# Patient Record
Sex: Male | Born: 1955
Health system: Southern US, Community
[De-identification: ages and names within clinical notes are randomized; demographics above are authoritative.]

## PROBLEM LIST (undated history)

## (undated) DIAGNOSIS — E785 Hyperlipidemia, unspecified: Secondary | ICD-10-CM

## (undated) DIAGNOSIS — I10 Essential (primary) hypertension: Secondary | ICD-10-CM

## (undated) HISTORY — PX: HERNIA REPAIR: SHX51

---

## 2000-06-21 ENCOUNTER — Ambulatory Visit (HOSPITAL_COMMUNITY): Admission: RE | Admit: 2000-06-21 | Discharge: 2000-06-21 | Payer: Self-pay | Admitting: Gastroenterology

## 2000-08-11 ENCOUNTER — Ambulatory Visit (HOSPITAL_COMMUNITY): Admission: RE | Admit: 2000-08-11 | Discharge: 2000-08-11 | Payer: Self-pay | Admitting: Gastroenterology

## 2004-04-08 ENCOUNTER — Ambulatory Visit (HOSPITAL_COMMUNITY): Admission: RE | Admit: 2004-04-08 | Discharge: 2004-04-08 | Payer: Self-pay | Admitting: General Surgery

## 2016-01-13 DIAGNOSIS — E782 Mixed hyperlipidemia: Secondary | ICD-10-CM | POA: Diagnosis not present

## 2016-01-13 DIAGNOSIS — K219 Gastro-esophageal reflux disease without esophagitis: Secondary | ICD-10-CM | POA: Diagnosis not present

## 2016-01-13 DIAGNOSIS — I10 Essential (primary) hypertension: Secondary | ICD-10-CM | POA: Diagnosis not present

## 2016-01-13 DIAGNOSIS — R7301 Impaired fasting glucose: Secondary | ICD-10-CM | POA: Diagnosis not present

## 2016-01-25 DIAGNOSIS — J018 Other acute sinusitis: Secondary | ICD-10-CM | POA: Diagnosis not present

## 2016-03-08 DIAGNOSIS — N400 Enlarged prostate without lower urinary tract symptoms: Secondary | ICD-10-CM | POA: Diagnosis not present

## 2016-03-08 DIAGNOSIS — N200 Calculus of kidney: Secondary | ICD-10-CM | POA: Diagnosis not present

## 2016-03-08 DIAGNOSIS — N3020 Other chronic cystitis without hematuria: Secondary | ICD-10-CM | POA: Diagnosis not present

## 2016-03-08 DIAGNOSIS — M545 Low back pain: Secondary | ICD-10-CM | POA: Diagnosis not present

## 2016-03-08 DIAGNOSIS — N401 Enlarged prostate with lower urinary tract symptoms: Secondary | ICD-10-CM | POA: Diagnosis not present

## 2016-07-21 DIAGNOSIS — K635 Polyp of colon: Secondary | ICD-10-CM | POA: Diagnosis not present

## 2016-07-21 DIAGNOSIS — D120 Benign neoplasm of cecum: Secondary | ICD-10-CM | POA: Diagnosis not present

## 2016-07-21 DIAGNOSIS — K648 Other hemorrhoids: Secondary | ICD-10-CM | POA: Diagnosis not present

## 2016-07-21 DIAGNOSIS — Z1211 Encounter for screening for malignant neoplasm of colon: Secondary | ICD-10-CM | POA: Diagnosis not present

## 2016-07-21 DIAGNOSIS — Z86010 Personal history of colonic polyps: Secondary | ICD-10-CM | POA: Diagnosis not present

## 2016-07-21 DIAGNOSIS — D125 Benign neoplasm of sigmoid colon: Secondary | ICD-10-CM | POA: Diagnosis not present

## 2016-07-21 DIAGNOSIS — K5730 Diverticulosis of large intestine without perforation or abscess without bleeding: Secondary | ICD-10-CM | POA: Diagnosis not present

## 2016-09-21 DIAGNOSIS — K648 Other hemorrhoids: Secondary | ICD-10-CM | POA: Diagnosis not present

## 2016-10-05 DIAGNOSIS — K648 Other hemorrhoids: Secondary | ICD-10-CM | POA: Diagnosis not present

## 2016-10-26 DIAGNOSIS — K219 Gastro-esophageal reflux disease without esophagitis: Secondary | ICD-10-CM | POA: Insufficient documentation

## 2016-10-26 DIAGNOSIS — K648 Other hemorrhoids: Secondary | ICD-10-CM | POA: Diagnosis not present

## 2016-10-26 DIAGNOSIS — Z86010 Personal history of colonic polyps: Secondary | ICD-10-CM | POA: Insufficient documentation

## 2016-11-02 DIAGNOSIS — K219 Gastro-esophageal reflux disease without esophagitis: Secondary | ICD-10-CM | POA: Diagnosis not present

## 2016-11-02 DIAGNOSIS — E782 Mixed hyperlipidemia: Secondary | ICD-10-CM | POA: Diagnosis not present

## 2016-11-02 DIAGNOSIS — I10 Essential (primary) hypertension: Secondary | ICD-10-CM | POA: Diagnosis not present

## 2016-11-02 DIAGNOSIS — R7301 Impaired fasting glucose: Secondary | ICD-10-CM | POA: Diagnosis not present

## 2016-11-03 DIAGNOSIS — R7301 Impaired fasting glucose: Secondary | ICD-10-CM | POA: Diagnosis not present

## 2016-11-03 DIAGNOSIS — I10 Essential (primary) hypertension: Secondary | ICD-10-CM | POA: Diagnosis not present

## 2016-11-03 DIAGNOSIS — E782 Mixed hyperlipidemia: Secondary | ICD-10-CM | POA: Diagnosis not present

## 2017-02-09 DIAGNOSIS — K219 Gastro-esophageal reflux disease without esophagitis: Secondary | ICD-10-CM | POA: Diagnosis not present

## 2017-02-09 DIAGNOSIS — R7301 Impaired fasting glucose: Secondary | ICD-10-CM | POA: Diagnosis not present

## 2017-02-09 DIAGNOSIS — I10 Essential (primary) hypertension: Secondary | ICD-10-CM | POA: Diagnosis not present

## 2017-02-09 DIAGNOSIS — E782 Mixed hyperlipidemia: Secondary | ICD-10-CM | POA: Diagnosis not present

## 2017-05-17 DIAGNOSIS — Z23 Encounter for immunization: Secondary | ICD-10-CM | POA: Diagnosis not present

## 2017-05-17 DIAGNOSIS — Z6832 Body mass index (BMI) 32.0-32.9, adult: Secondary | ICD-10-CM | POA: Diagnosis not present

## 2017-05-17 DIAGNOSIS — F17200 Nicotine dependence, unspecified, uncomplicated: Secondary | ICD-10-CM | POA: Diagnosis not present

## 2017-05-17 DIAGNOSIS — R413 Other amnesia: Secondary | ICD-10-CM | POA: Diagnosis not present

## 2017-05-17 DIAGNOSIS — Z0001 Encounter for general adult medical examination with abnormal findings: Secondary | ICD-10-CM | POA: Diagnosis not present

## 2017-05-17 DIAGNOSIS — E782 Mixed hyperlipidemia: Secondary | ICD-10-CM | POA: Diagnosis not present

## 2017-05-17 DIAGNOSIS — R7301 Impaired fasting glucose: Secondary | ICD-10-CM | POA: Diagnosis not present

## 2017-08-25 DIAGNOSIS — R7301 Impaired fasting glucose: Secondary | ICD-10-CM | POA: Diagnosis not present

## 2017-08-25 DIAGNOSIS — I10 Essential (primary) hypertension: Secondary | ICD-10-CM | POA: Diagnosis not present

## 2017-08-25 DIAGNOSIS — E782 Mixed hyperlipidemia: Secondary | ICD-10-CM | POA: Diagnosis not present

## 2017-08-25 DIAGNOSIS — K219 Gastro-esophageal reflux disease without esophagitis: Secondary | ICD-10-CM | POA: Diagnosis not present

## 2017-08-30 DIAGNOSIS — H903 Sensorineural hearing loss, bilateral: Secondary | ICD-10-CM | POA: Insufficient documentation

## 2017-08-30 DIAGNOSIS — H938X2 Other specified disorders of left ear: Secondary | ICD-10-CM | POA: Diagnosis not present

## 2017-08-30 DIAGNOSIS — H9312 Tinnitus, left ear: Secondary | ICD-10-CM | POA: Insufficient documentation

## 2017-09-12 ENCOUNTER — Ambulatory Visit: Payer: Self-pay | Admitting: Surgery

## 2017-09-12 DIAGNOSIS — L723 Sebaceous cyst: Secondary | ICD-10-CM | POA: Diagnosis not present

## 2017-09-12 NOTE — H&P (Signed)
Steve Orozco Documented: 09/12/2017 3:38 PM Location: [facility] Patient #: [id number] DOB: 05/26/1956 Married / Language: English / Race: White Male  History of Present Illness (Renessa Wellnitz A. Fredricka Bonineonnor MD; 09/12/2017 3:58 PM) Patient words: This is a very nice 62 year old man with multiple sebaceous cysts. They've been present for many years. Periodically his wife will drain them, but he's never required any procedures or medications to treatment. They don't cause him any pain in particular that when a drain there very malodorous and he would like to have them excised. Hhe has 3 on his back, one in the posterior scalp above the hairline, and one above the right ear. He also complains of several small cysts in the crease just posterior to his earlobe.  The patient is a 62 year old male.   Past Surgical History (Tanisha A. Manson PasseyBrown, RMA; 09/12/2017 3:38 PM) Colon Polyp Removal - Colonoscopy Hemorrhoidectomy Oral Surgery Tonsillectomy Vasectomy  Diagnostic Studies History (Tanisha A. Manson PasseyBrown, RMA; 09/12/2017 3:38 PM) Colonoscopy within last year  Allergies (Tanisha A. Manson PasseyBrown, RMA; 09/12/2017 3:40 PM) No Known Drug Allergies [09/12/2017]: Allergies Reconciled  Medication History (Tanisha A. Manson PasseyBrown, RMA; 09/12/2017 3:41 PM) Lisinopril (10MG  Tablet, Oral) Active. Aciphex (20MG  Tablet DR, Oral) Active. Crestor (20MG  Tablet, Oral) Active. Medications Reconciled  Social History (Tanisha A. Manson PasseyBrown, RMA; 09/12/2017 3:38 PM) Alcohol use Occasional alcohol use. Caffeine use Carbonated beverages, Coffee, Tea. No drug use Tobacco use Current every day smoker.  Family History (Tanisha A. Manson PasseyBrown, RMA; 09/12/2017 3:38 PM) Hypertension Father. Prostate Cancer Father.  Other Problems (Tanisha A. Manson PasseyBrown, RMA; 09/12/2017 3:38 PM) Gastroesophageal Reflux Disease Hepatitis Hypercholesterolemia Kidney Stone     Review of Systems (Tanisha A. Brown RMA; 09/12/2017 3:39  PM) General Not Present- Appetite Loss, Chills, Fatigue, Fever, Night Sweats, Weight Gain and Weight Loss. Skin Not Present- Change in Wart/Mole, Dryness, Hives, Jaundice, New Lesions, Non-Healing Wounds, Rash and Ulcer. HEENT Present- Hearing Loss. Not Present- Earache, Hoarseness, Nose Bleed, Oral Ulcers, Ringing in the Ears, Seasonal Allergies, Sinus Pain, Sore Throat, Visual Disturbances, Wears glasses/contact lenses and Yellow Eyes. Respiratory Not Present- Bloody sputum, Chronic Cough, Difficulty Breathing, Snoring and Wheezing. Breast Not Present- Breast Mass, Breast Pain, Nipple Discharge and Skin Changes. Cardiovascular Not Present- Chest Pain, Difficulty Breathing Lying Down, Leg Cramps, Palpitations, Rapid Heart Rate, Shortness of Breath and Swelling of Extremities. Gastrointestinal Not Present- Abdominal Pain, Bloating, Bloody Stool, Change in Bowel Habits, Chronic diarrhea, Constipation, Difficulty Swallowing, Excessive gas, Gets full quickly at meals, Hemorrhoids, Indigestion, Nausea, Rectal Pain and Vomiting. Male Genitourinary Not Present- Blood in Urine, Change in Urinary Stream, Frequency, Impotence, Nocturia, Painful Urination, Urgency and Urine Leakage. Musculoskeletal Not Present- Back Pain, Joint Pain, Joint Stiffness, Muscle Pain, Muscle Weakness and Swelling of Extremities. Neurological Not Present- Decreased Memory, Fainting, Headaches, Numbness, Seizures, Tingling, Tremor, Trouble walking and Weakness. Psychiatric Not Present- Anxiety, Bipolar, Change in Sleep Pattern, Depression, Fearful and Frequent crying. Endocrine Not Present- Cold Intolerance, Excessive Hunger, Hair Changes, Heat Intolerance, Hot flashes and New Diabetes. Hematology Not Present- Blood Thinners, Easy Bruising, Excessive bleeding, Gland problems, HIV and Persistent Infections.  Vitals (Tanisha A. Brown RMA; 09/12/2017 3:40 PM) 09/12/2017 3:39 PM Weight: 229.4 lb Height: 70in Body Surface Area: 2.21  m Body Mass Index: 32.92 kg/m  Temp.: 98.3F  Pulse: 82 (Regular)  BP: 128/76 (Sitting, Left Arm, Standard)      Physical Exam (Ajmal Kathan A. Fredricka Bonineonnor MD; 09/12/2017 4:00 PM)  General Note: alert and well appearing  Integumentary Note: warm and dry.  Behind each ear in the crease behind the earlobe, there are multiple very small papules. There is a subcentimeter cyst just superior posterior to the right ear. No active drainage. Similarly there is a subcentimeter cyst in the posterior scalp just above the hairline. There are 3 cysts along the upper and mid back the largest of which is about 3 cm in diameter  Head and Neck Note: no mass or thyromegaly  Eye Note: No scleral icterus. Extra ocular motions intact.  ENMT Note: Moist mucous membranes, dentition intact Hearing aids in place  Chest and Lung Exam Note: Unlabored respirations, clear bilaterally  Cardiovascular Note: Regular rate and rhythm, no pedal edema  Abdomen Note: Soft, nontender nondistended. No mass or organomegaly.  Neurologic Note: Grossly intact, normal gait  Neuropsychiatric Note: Normal mood and affect. Appropriate insight.  Musculoskeletal Note: Strength symmetrical throughout, no deformity    Assessment & Plan (Terea Neubauer A. Fredricka Bonine MD; 09/12/2017 4:01 PM)  SEBACEOUS CYST (L72.3) Story: Multiple sebaceous cysts, desires excision. Discussed procedure including risks of bleeding, infection, pain, scarring, wound problems. I recommended not intervening on the small papules behind his ears but we can remove the cyst above his right ear, and the posterior scalp and in the mid back. He expressed understanding and desires to proceed.

## 2017-11-24 DIAGNOSIS — R7301 Impaired fasting glucose: Secondary | ICD-10-CM | POA: Diagnosis not present

## 2017-11-24 DIAGNOSIS — I10 Essential (primary) hypertension: Secondary | ICD-10-CM | POA: Diagnosis not present

## 2017-11-24 DIAGNOSIS — E782 Mixed hyperlipidemia: Secondary | ICD-10-CM | POA: Diagnosis not present

## 2017-11-24 DIAGNOSIS — K219 Gastro-esophageal reflux disease without esophagitis: Secondary | ICD-10-CM | POA: Diagnosis not present

## 2017-11-24 DIAGNOSIS — Z125 Encounter for screening for malignant neoplasm of prostate: Secondary | ICD-10-CM | POA: Diagnosis not present

## 2018-02-20 DIAGNOSIS — N3020 Other chronic cystitis without hematuria: Secondary | ICD-10-CM | POA: Diagnosis not present

## 2018-02-20 DIAGNOSIS — N401 Enlarged prostate with lower urinary tract symptoms: Secondary | ICD-10-CM | POA: Diagnosis not present

## 2018-10-02 DIAGNOSIS — L0211 Cutaneous abscess of neck: Secondary | ICD-10-CM | POA: Diagnosis not present

## 2018-10-11 DIAGNOSIS — L820 Inflamed seborrheic keratosis: Secondary | ICD-10-CM | POA: Diagnosis not present

## 2018-10-11 DIAGNOSIS — L720 Epidermal cyst: Secondary | ICD-10-CM | POA: Diagnosis not present

## 2018-10-11 DIAGNOSIS — L918 Other hypertrophic disorders of the skin: Secondary | ICD-10-CM | POA: Diagnosis not present

## 2018-10-11 DIAGNOSIS — L7211 Pilar cyst: Secondary | ICD-10-CM | POA: Diagnosis not present

## 2018-10-11 DIAGNOSIS — L821 Other seborrheic keratosis: Secondary | ICD-10-CM | POA: Diagnosis not present

## 2018-10-15 DIAGNOSIS — L720 Epidermal cyst: Secondary | ICD-10-CM | POA: Diagnosis not present

## 2018-10-15 DIAGNOSIS — L0211 Cutaneous abscess of neck: Secondary | ICD-10-CM | POA: Diagnosis not present

## 2018-11-15 DIAGNOSIS — L720 Epidermal cyst: Secondary | ICD-10-CM | POA: Diagnosis not present

## 2018-12-10 DIAGNOSIS — K219 Gastro-esophageal reflux disease without esophagitis: Secondary | ICD-10-CM | POA: Diagnosis not present

## 2018-12-10 DIAGNOSIS — I10 Essential (primary) hypertension: Secondary | ICD-10-CM | POA: Diagnosis not present

## 2018-12-10 DIAGNOSIS — R7301 Impaired fasting glucose: Secondary | ICD-10-CM | POA: Diagnosis not present

## 2018-12-10 DIAGNOSIS — E782 Mixed hyperlipidemia: Secondary | ICD-10-CM | POA: Diagnosis not present

## 2018-12-11 DIAGNOSIS — Z125 Encounter for screening for malignant neoplasm of prostate: Secondary | ICD-10-CM | POA: Diagnosis not present

## 2018-12-11 DIAGNOSIS — I10 Essential (primary) hypertension: Secondary | ICD-10-CM | POA: Diagnosis not present

## 2018-12-11 DIAGNOSIS — R7301 Impaired fasting glucose: Secondary | ICD-10-CM | POA: Diagnosis not present

## 2018-12-11 DIAGNOSIS — E782 Mixed hyperlipidemia: Secondary | ICD-10-CM | POA: Diagnosis not present

## 2019-01-21 ENCOUNTER — Inpatient Hospital Stay (HOSPITAL_COMMUNITY)
Admission: EM | Admit: 2019-01-21 | Discharge: 2019-01-23 | DRG: 247 | Disposition: A | Discharge status: Home / Self Care | Payer: BC Managed Care – PPO | Source: Ambulatory Visit | Attending: Family Medicine | Admitting: Family Medicine

## 2019-01-21 ENCOUNTER — Emergency Department (HOSPITAL_COMMUNITY): Payer: BC Managed Care – PPO

## 2019-01-21 ENCOUNTER — Other Ambulatory Visit: Payer: Self-pay

## 2019-01-21 ENCOUNTER — Encounter (HOSPITAL_COMMUNITY): Payer: Self-pay | Admitting: *Deleted

## 2019-01-21 DIAGNOSIS — Z955 Presence of coronary angioplasty implant and graft: Secondary | ICD-10-CM

## 2019-01-21 DIAGNOSIS — E785 Hyperlipidemia, unspecified: Secondary | ICD-10-CM | POA: Diagnosis present

## 2019-01-21 DIAGNOSIS — I25110 Atherosclerotic heart disease of native coronary artery with unstable angina pectoris: Secondary | ICD-10-CM | POA: Diagnosis not present

## 2019-01-21 DIAGNOSIS — K219 Gastro-esophageal reflux disease without esophagitis: Secondary | ICD-10-CM | POA: Diagnosis present

## 2019-01-21 DIAGNOSIS — Z6832 Body mass index (BMI) 32.0-32.9, adult: Secondary | ICD-10-CM | POA: Diagnosis not present

## 2019-01-21 DIAGNOSIS — F17200 Nicotine dependence, unspecified, uncomplicated: Secondary | ICD-10-CM | POA: Diagnosis not present

## 2019-01-21 DIAGNOSIS — F17210 Nicotine dependence, cigarettes, uncomplicated: Secondary | ICD-10-CM | POA: Diagnosis not present

## 2019-01-21 DIAGNOSIS — I10 Essential (primary) hypertension: Secondary | ICD-10-CM | POA: Diagnosis not present

## 2019-01-21 DIAGNOSIS — R072 Precordial pain: Secondary | ICD-10-CM | POA: Diagnosis not present

## 2019-01-21 DIAGNOSIS — Z79899 Other long term (current) drug therapy: Secondary | ICD-10-CM | POA: Diagnosis not present

## 2019-01-21 DIAGNOSIS — Z20828 Contact with and (suspected) exposure to other viral communicable diseases: Secondary | ICD-10-CM | POA: Diagnosis present

## 2019-01-21 DIAGNOSIS — I200 Unstable angina: Secondary | ICD-10-CM | POA: Diagnosis not present

## 2019-01-21 DIAGNOSIS — Z8249 Family history of ischemic heart disease and other diseases of the circulatory system: Secondary | ICD-10-CM | POA: Diagnosis not present

## 2019-01-21 DIAGNOSIS — R079 Chest pain, unspecified: Secondary | ICD-10-CM | POA: Diagnosis not present

## 2019-01-21 DIAGNOSIS — R0902 Hypoxemia: Secondary | ICD-10-CM | POA: Diagnosis not present

## 2019-01-21 HISTORY — DX: Essential (primary) hypertension: I10

## 2019-01-21 HISTORY — DX: Hyperlipidemia, unspecified: E78.5

## 2019-01-21 LAB — CBC WITH DIFFERENTIAL/PLATELET
Abs Immature Granulocytes: 0.04 10*3/uL (ref 0.00–0.07)
Basophils Absolute: 0.1 10*3/uL (ref 0.0–0.1)
Basophils Relative: 1 %
Eosinophils Absolute: 0.3 10*3/uL (ref 0.0–0.5)
Eosinophils Relative: 4 %
HCT: 47.9 % (ref 39.0–52.0)
Hemoglobin: 15.3 g/dL (ref 13.0–17.0)
Immature Granulocytes: 0 %
Lymphocytes Relative: 37 %
Lymphs Abs: 3.4 10*3/uL (ref 0.7–4.0)
MCH: 26.9 pg (ref 26.0–34.0)
MCHC: 31.9 g/dL (ref 30.0–36.0)
MCV: 84.3 fL (ref 80.0–100.0)
Monocytes Absolute: 0.6 10*3/uL (ref 0.1–1.0)
Monocytes Relative: 6 %
Neutro Abs: 4.9 10*3/uL (ref 1.7–7.7)
Neutrophils Relative %: 52 %
Platelets: 166 10*3/uL (ref 150–400)
RBC: 5.68 MIL/uL (ref 4.22–5.81)
RDW: 13.1 % (ref 11.5–15.5)
WBC: 9.2 10*3/uL (ref 4.0–10.5)
nRBC: 0 % (ref 0.0–0.2)

## 2019-01-21 LAB — BASIC METABOLIC PANEL
Anion gap: 12 (ref 5–15)
BUN: 16 mg/dL (ref 8–23)
CO2: 23 mmol/L (ref 22–32)
Calcium: 9.6 mg/dL (ref 8.9–10.3)
Chloride: 104 mmol/L (ref 98–111)
Creatinine, Ser: 1.15 mg/dL (ref 0.61–1.24)
GFR calc Af Amer: >60 mL/min (ref >60)
GFR calc non Af Amer: >60 mL/min (ref >60)
Glucose, Bld: 79 mg/dL (ref 70–99)
Potassium: 4 mmol/L (ref 3.5–5.1)
Sodium: 139 mmol/L (ref 135–145)

## 2019-01-21 LAB — TROPONIN I
Troponin I: <0.03 ng/mL (ref <0.03)
Troponin I: <0.03 ng/mL (ref <0.03)

## 2019-01-21 LAB — SARS CORONAVIRUS 2 BY RT PCR (HOSPITAL ORDER, PERFORMED IN ~~LOC~~ HOSPITAL LAB): SARS Coronavirus 2: NEGATIVE

## 2019-01-21 MED ORDER — ACETAMINOPHEN 325 MG PO TABS: 650.0000 mg | ORAL_TABLET | ORAL | Status: DC | PRN

## 2019-01-21 MED ORDER — HEPARIN BOLUS VIA INFUSION
4000.0000 [IU] | Freq: Once | INTRAVENOUS | Status: AC
  Administered 2019-01-21: 21:00:00 4000 [IU] via INTRAVENOUS
  Filled 2019-01-21: qty 4000

## 2019-01-21 MED ORDER — ONDANSETRON HCL 4 MG/2ML IJ SOLN: 4.0000 mg | Freq: Four times a day (QID) | INTRAMUSCULAR | Status: DC | PRN

## 2019-01-21 MED ORDER — HEPARIN (PORCINE) 25000 UT/250ML-% IV SOLN
1400.0000 [IU]/h | INTRAVENOUS | Status: DC
  Administered 2019-01-21: 1150 [IU]/h via INTRAVENOUS
  Administered 2019-01-22: 13:00:00 1400 [IU]/h via INTRAVENOUS
  Filled 2019-01-21 (×2): qty 250

## 2019-01-21 MED ORDER — NITROGLYCERIN 0.4 MG SL SUBL: 0.4000 mg | SUBLINGUAL_TABLET | SUBLINGUAL | Status: DC | PRN

## 2019-01-21 MED ORDER — ACETAMINOPHEN 325 MG PO TABS
650.0000 mg | ORAL_TABLET | Freq: Once | ORAL | Status: DC
  Filled 2019-01-21: qty 2

## 2019-01-21 MED ORDER — NITROGLYCERIN 2 % TD OINT
0.5000 [in_us] | TOPICAL_OINTMENT | Freq: Four times a day (QID) | TRANSDERMAL | Status: DC
  Administered 2019-01-22 (×2): 0.5 [in_us] via TOPICAL
  Filled 2019-01-21: qty 30

## 2019-01-21 NOTE — ED Triage Notes (Signed)
To ED via [place], from [place] office. States he went to md's office due to chest discomfort over the weekend. No fevers, no sob. No pain now - nitro x2 and full asa given pta. Skin w/d, resp e/u

## 2019-01-21 NOTE — ED Notes (Signed)
Nurse Navigator communication: wife Verdis Frederickson is currently is in the parking lot and was provided on plan of care. She will go home but wishes to be updated if the patient is either to be d/c or admitted.

## 2019-01-21 NOTE — ED Notes (Signed)
Report given to 3E RN. All questions answered 

## 2019-01-21 NOTE — H&P (Signed)
History and Physical   Steve Orozco IHW:[id number] DOB: Nov 04, 1955 DOA: 01/21/2019  Referring MD/NP/PA: Reuben Martinique, NP  PCP: Rochel Brome, MD   Outpatient Specialists: None  Patient coming from: Home  Chief Complaint: Chest pain  HPI: Steve Orozco is a 63 y.o. male with medical history significant of hypertension, hyperlipidemia, morbid obesity who presented to the ER with intermittent chest pain in the last 4 weeks but got worse today.  Patient does yard work mostly.  And he realized the pain is worse with a yard work.  Rated as 7 out of 10.  It radiates to his arms.  No diaphoresis.  Symptoms are relieved by rest.  Patient has taken aspirin and nitroglycerin that improve his symptoms.  He went to see his PCP.  Patient decided to come to the hospital where he is being evaluated.  Patient has no leg swelling.  No PND no orthopnea.  Denied any abdominal pain.  No hematemesis.  He smokes about half to 1 pack/day..  ED Course: His temperature is 98 blood pressure is 120/90 pulse 40-66 respiratory rate of 22 oxygen sat 90% on room air..  Patient's CBC and chemistry is within normal.  Troponin is less than 0.03. COVID-19 is negative.  Chest x-ray showed no active findings.  EKG showed no acute findings.  Patient is being admitted for rule out MI.  Review of Systems: As per HPI otherwise 10 point review of systems negative.    Past Medical History:  Diagnosis Date  . Hyperlipidemia   . Hypertension     Past Surgical History:  Procedure Laterality Date  . HERNIA REPAIR       reports that he has been smoking. He has a 17.50 pack-year smoking history. He has never used smokeless tobacco. He reports current alcohol use of about 1.0 standard drinks of alcohol per week. He reports that he does not use drugs.  No Known Allergies  Family History  Problem Relation Age of Onset  . AAA (abdominal aortic aneurysm) Mother   . CAD Father      Prior to Admission medications   Not on  File    Physical Exam: Vitals:   01/21/19 2000 01/21/19 2100 01/21/19 2206 01/22/19 0029  BP: 120/90 112/79 109/83 92/63  Pulse:  (!) 55 65 66  Resp: 18 18  18   Temp:   98.3 F (36.8 C) 98 F (36.7 C)  TempSrc:   Oral Oral  SpO2:  93% 95% 96%  Weight:   103.2 kg   Height:   5\' 10"  (1.778 m)       Constitutional: NAD, calm, comfortable Vitals:   01/21/19 2000 01/21/19 2100 01/21/19 2206 01/22/19 0029  BP: 120/90 112/79 109/83 92/63  Pulse:  (!) 55 65 66  Resp: 18 18  18   Temp:   98.3 F (36.8 C) 98 F (36.7 C)  TempSrc:   Oral Oral  SpO2:  93% 95% 96%  Weight:   103.2 kg   Height:   5\' 10"  (1.778 m)    Eyes: PERRL, lids and conjunctivae normal ENMT: Mucous membranes are moist. Posterior pharynx clear of any exudate or lesions.Normal dentition.  Neck: normal, supple, no masses, no thyromegaly Respiratory: clear to auscultation bilaterally, no wheezing, no crackles. Normal respiratory effort. No accessory muscle use.  Cardiovascular: Regular rate and rhythm, no murmurs / rubs / gallops. No extremity edema. 2+ pedal pulses. No carotid bruits.  Abdomen: no tenderness, no masses palpated. No hepatosplenomegaly. Bowel sounds  positive.  Musculoskeletal: no clubbing / cyanosis. No joint deformity upper and lower extremities. Good ROM, no contractures. Normal muscle tone.  Skin: no rashes, lesions, ulcers. No induration Neurologic: CN 2-12 grossly intact. Sensation intact, DTR normal. Strength 5/5 in all 4.  Psychiatric: Normal judgment and insight. Alert and oriented x 3. Normal mood.     Labs on Admission: I have personally reviewed following labs and imaging studies  CBC: Recent Labs  Lab 01/21/19 1650  WBC 9.2  NEUTROABS 4.9  HGB 15.3  HCT 47.9  MCV 84.3  PLT 166   Basic Metabolic Panel: Recent Labs  Lab 01/21/19 1650  NA 139  K 4.0  CL 104  CO2 23  GLUCOSE 79  BUN 16  CREATININE 1.15  CALCIUM 9.6   GFR: Estimated Creatinine Clearance: 80.2 mL/min  (by C-G formula based on SCr of 1.15 mg/dL). Liver Function Tests: No results for input(s): AST, ALT, ALKPHOS, BILITOT, PROT, ALBUMIN in the last 168 hours. No results for input(s): LIPASE, AMYLASE in the last 168 hours. No results for input(s): AMMONIA in the last 168 hours. Coagulation Profile: No results for input(s): INR, PROTIME in the last 168 hours. Cardiac Enzymes: Recent Labs  Lab 01/21/19 1650 01/21/19 2200  TROPONINI <0.03 <0.03   BNP (last 3 results) No results for input(s): PROBNP in the last 8760 hours. HbA1C: No results for input(s): HGBA1C in the last 72 hours. CBG: No results for input(s): GLUCAP in the last 168 hours. Lipid Profile: No results for input(s): CHOL, HDL, LDLCALC, TRIG, CHOLHDL, LDLDIRECT in the last 72 hours. Thyroid Function Tests: No results for input(s): TSH, T4TOTAL, FREET4, T3FREE, THYROIDAB in the last 72 hours. Anemia Panel: No results for input(s): VITAMINB12, FOLATE, FERRITIN, TIBC, IRON, RETICCTPCT in the last 72 hours. Urine analysis: No results found for: COLORURINE, APPEARANCEUR, LABSPEC, PHURINE, GLUCOSEU, HGBUR, BILIRUBINUR, KETONESUR, PROTEINUR, UROBILINOGEN, NITRITE, LEUKOCYTESUR Sepsis Labs: @LABRCNTIP (procalcitonin:4,lacticidven:4) ) Recent Results (from the past 240 hour(s))  SARS Coronavirus 2 (CEPHEID - Performed in [facility] hospital lab), Hosp Order     Status: None   Collection Time: 01/21/19  8:55 PM  Result Value Ref Range Status   SARS Coronavirus 2 NEGATIVE NEGATIVE Final    Comment: (NOTE) If result is NEGATIVE SARS-CoV-2 target nucleic acids are NOT DETECTED. The SARS-CoV-2 RNA is generally detectable in upper and lower  respiratory specimens during the acute phase of infection. The lowest  concentration of SARS-CoV-2 viral copies this assay can detect is 250  copies / mL. A negative result does not preclude SARS-CoV-2 infection  and should not be used as the sole basis for treatment or other  patient  management decisions.  A negative result may occur with  improper specimen collection / handling, submission of specimen other  than nasopharyngeal swab, presence of viral mutation(s) within the  areas targeted by this assay, and inadequate number of viral copies  (<250 copies / mL). A negative result must be combined with clinical  observations, patient history, and epidemiological information. If result is POSITIVE SARS-CoV-2 target nucleic acids are DETECTED. The SARS-CoV-2 RNA is generally detectable in upper and lower  respiratory specimens dur ing the acute phase of infection.  Positive  results are indicative of active infection with SARS-CoV-2.  Clinical  correlation with patient history and other diagnostic information is  necessary to determine patient infection status.  Positive results do  not rule out bacterial infection or co-infection with other viruses. If result is PRESUMPTIVE POSTIVE SARS-CoV-2 nucleic acids MAY  BE PRESENT.   A presumptive positive result was obtained on the submitted specimen  and confirmed on repeat testing.  While 2019 novel coronavirus  (SARS-CoV-2) nucleic acids may be present in the submitted sample  additional confirmatory testing may be necessary for epidemiological  and / or clinical management purposes  to differentiate between  SARS-CoV-2 and other Sarbecovirus currently known to infect humans.  If clinically indicated additional testing with an alternate test  methodology [phone]([id number]) is advised. The SARS-CoV-2 RNA is generally  detectable in upper and lower respiratory sp ecimens during the acute  phase of infection. The expected result is Negative. Fact Sheet for Patients:  BoilerBrush.com.cyhttps://www.fda.gov/media/136312/download Fact Sheet for Healthcare Providers: https://pope.com/https://www.fda.gov/media/136313/download This test is not yet approved or cleared by the [place] FDA and has been authorized for detection and/or diagnosis of SARS-CoV-2 by FDA under  an Emergency Use Authorization (EUA).  This EUA will remain in effect (meaning this test can be used) for the duration of the COVID-19 declaration under Section 564(b)(1) of the Act, 21 U.S.C. section 360bbb-3(b)(1), unless the authorization is terminated or revoked sooner. Performed at [facility], [address] [place], [place] [address]      Radiological Exams on Admission: Dg Chest 2 View  Result Date: 01/21/2019 CLINICAL DATA:  Chest pain intermittently for 4 weeks. EXAM: CHEST - 2 VIEW COMPARISON:  None. FINDINGS: The heart size and mediastinal contours are within normal limits. Both lungs are clear. The visualized skeletal structures are unremarkable. IMPRESSION: No active cardiopulmonary disease. Electronically Signed   By: Gerome Samavid  Williams III M.D   On: 01/21/2019 17:26    EKG: Independently reviewed.  EKG shows sinus rhythm with a rate of 56.  Borderline PR interval elongation.  No ST changes  Assessment/Plan Principal Problem:   Unstable angina (HCC) Active Problems:   Benign essential HTN   Hyperlipidemia     #1  Unstable angina: Patient will be admitted and initiated on aspirin and nitroglycerin.  Start heparin while monitoring enzymes.  Will get echocardiogram and possibly outpatient cardiac stress testing if enzymes are negative.  Control blood pressure.  #2 hypertension:Patient on lisinopril from home.  We will continue.  #3 hyperlipidemia: On fenofibrate and Crestor.  Continue  #4 GERD: Continue with PPIs  DVT prophylaxis: Heparin drip Code Status: Full code Family Communication: Discussed care with the patient Disposition Plan: Home Consults called: None Admission status: Observation  Severity of Illness: The appropriate patient status for this patient is OBSERVATION. Observation status is judged to be reasonable and necessary in order to provide the required intensity of service to ensure the patient's safety. The patient's presenting symptoms,  physical exam findings, and initial radiographic and laboratory data in the context of their medical condition is felt to place them at decreased risk for further clinical deterioration. Furthermore, it is anticipated that the patient will be medically stable for discharge from the hospital within 2 midnights of admission. The following factors support the patient status of observation.   " The patient's presenting symptoms include chest pain. " The physical exam findings include no significant findings. " The initial radiographic and laboratory data are normal x-ray and EKG.     Lonia BloodGARBA,LAWAL MD [facility] Pager [phone]  If 7PM-7AM, please contact night-coverage [email or web address] Password [email or web address]  01/22/2019, 12:32 AM

## 2019-01-21 NOTE — Consult Note (Signed)
Cardiology Consultation:   Patient ID: Steve Orozco MRN: [id number]; DOB: 06/24/1956  Admit date: 01/21/2019 Date of Consult: 01/21/2019  Primary Care Provider: Blane Oharaox, Kirsten, MD Primary Cardiologist: New Primary Electrophysiologist:  None    Patient Profile:   Steve Orozco is a 63 y.o. male with a hx of HTN, hyperlipidemia, tobacco abuse who is being seen today for the evaluation of chest pain at the request of Dr Steve Orozco.  History of Present Illness:   Mr. Steve Orozco 63 yo male no prior cardiac history but CAD risk factors including HTN, hyperlipidemia, family history of CAD with father MIs in his 2150s,  and tobacco use presents with chest pain.   Reports symptoms started 1 month ago. Dull/pressure pain midchest with SOB with high levels of exertion doing yard work. Would stop and rest and symptoms resolve. Symptoms have progressed over the last few weeks. On Saturday he noted the pain was more severe and coming on with lower levels of exertion. Still better with rest. On Monday he noticed he started having the pain at rest. Seen by pcp, given SL NG with improvement in symptoms.    K 4 Cr 1.15 BUN 16 WBC 9.2 Hgb 15.3 Plt 166 Trop neg x 1 EKG SR, no acute ischemic changes CXR no acute process     Past Medical History:  Diagnosis Date  . Hypertension        Inpatient Medications: Scheduled Meds: . acetaminophen  650 mg Oral Once   Continuous Infusions:  PRN Meds:   Allergies:   No Known Allergies  Social History:   Social History   Socioeconomic History  . Marital status: Married    Spouse name: Not on file  . Number of children: Not on file  . Years of education: Not on file  . Highest education level: Not on file  Occupational History  . Not on file  Social Needs  . Financial resource strain: Not on file  . Food insecurity:    Worry: Not on file    Inability: Not on file  . Transportation needs:    Medical: Not on file    Non-medical: Not on  file  Tobacco Use  . Smoking status: Not on file  Substance and Sexual Activity  . Alcohol use: Not on file  . Drug use: Not on file  . Sexual activity: Not on file  Lifestyle  . Physical activity:    Days per week: Not on file    Minutes per session: Not on file  . Stress: Not on file  Relationships  . Social connections:    Talks on phone: Not on file    Gets together: Not on file    Attends religious service: Not on file    Active member of club or organization: Not on file    Attends meetings of clubs or organizations: Not on file    Relationship status: Not on file  . Intimate partner violence:    Fear of current or ex partner: Not on file    Emotionally abused: Not on file    Physically abused: Not on file    Forced sexual activity: Not on file  Other Topics Concern  . Not on file  Social History Narrative  . Not on file    Family History:   Father with history of MIs in his 5550s  ROS:  Please see the history of present illness.  All other ROS reviewed and negative.     Physical  Exam/Data:   Vitals:   01/21/19 1730 01/21/19 1745 01/21/19 1800 01/21/19 1815  BP: (!) 118/57 (!) 106/92 105/73 112/70  Pulse:    (!) 52  Resp: 15 16 11 16   Temp:      TempSrc:      SpO2:    94%  Weight:      Height:       No intake or output data in the 24 hours ending 01/21/19 1924 Last 3 Weights 01/21/2019  Weight (lbs) 230 lb  Weight (kg) 104.327 kg     Body mass index is 33 kg/m.  General:  Well nourished, well developed, in no acute distress HEENT: normal Lymph: no adenopathy Neck: no JVD Endocrine:  No thryomegaly Vascular: No carotid bruits; FA pulses 2+ bilaterally without bruits  Cardiac:  normal S1, S2; RRR; no murmur  Lungs:  clear to auscultation bilaterally, no wheezing, rhonchi or rales  Abd: soft, nontender, no hepatomegaly  Ext: no edema Musculoskeletal:  No deformities, BUE and BLE strength normal and equal Skin: warm and dry  Neuro:  CNs 2-12 intact,  no focal abnormalities noted Psych:  Normal affect   EKG:  The EKG was personally reviewed and demonstrates:  NSR, no ischemic changes Telemetry:  Telemetry was personally reviewed and demonstrates:  SR   Laboratory Data:  Chemistry Recent Labs  Lab 01/21/19 1650  NA 139  K 4.0  CL 104  CO2 23  GLUCOSE 79  BUN 16  CREATININE 1.15  CALCIUM 9.6  GFRNONAA >60  GFRAA >60  ANIONGAP 12    No results for input(s): PROT, ALBUMIN, AST, ALT, ALKPHOS, BILITOT in the last 168 hours. Hematology Recent Labs  Lab 01/21/19 1650  WBC 9.2  RBC 5.68  HGB 15.3  HCT 47.9  MCV 84.3  MCH 26.9  MCHC 31.9  RDW 13.1  PLT 166   Cardiac Enzymes Recent Labs  Lab 01/21/19 1650  TROPONINI <0.03   No results for input(s): TROPIPOC in the last 168 hours.  BNPNo results for input(s): BNP, PROBNP in the last 168 hours.  DDimer No results for input(s): DDIMER in the last 168 hours.  Radiology/Studies:  Dg Chest 2 View  Result Date: 01/21/2019 CLINICAL DATA:  Chest pain intermittently for 4 weeks. EXAM: CHEST - 2 VIEW COMPARISON:  None. FINDINGS: The heart size and mediastinal contours are within normal limits. Both lungs are clear. The visualized skeletal structures are unremarkable. IMPRESSION: No active cardiopulmonary disease. Electronically Signed   By: Steve Orozco M.D   On: 01/21/2019 17:26    Assessment and Plan:   1. Chest pain - concern for unstable angina, fairly textbook description though his EKG and enzymes thus far are benign - cycle enzymes and EKG, obtain echo.  - medical therapy with ASA, hep gtt, NG patch for now. Some mild brady and soft bp's would avoid beta blockers or ACE-Is now pending vitals and confirmation of ACS. Continue his home statin( from 2019 H&P was on crestor 20mg , awaiting updated med rec) - check HgbA1c, FLP - npo at midnight. Consider noninvasive vs invasive ischemic testing based on additional workup, with his classic progressing symptoms would  have a low threshold for invasive cath     For questions or updates, please contact [place] Please consult [email or web address] for contact info under     Signed, Steve Dolly, MD  01/21/2019 7:24 PM

## 2019-01-21 NOTE — ED Notes (Signed)
ED TO INPATIENT HANDOFF REPORT  ED Nurse Name and Phone #: [phone] Donetta PottsSarah  S Name/Age/Gender Steve Orozco 63 y.o. male Room/Bed: 035C/035C  Code Status   Code Status: Full Code  Home/SNF/Other Home Patient oriented to: self, place, time and situation Is this baseline? Yes   Triage Complete: Triage complete  Chief Complaint CP  Triage Note To ED via [facility], from pmd office. States he went to md's office due to chest discomfort over the weekend. No fevers, no sob. No pain now - nitro x2 and full asa given pta. Skin w/d, resp e/u   Allergies No Known Allergies  Level of Care/Admitting Diagnosis ED Disposition    ED Disposition Condition Comment   Admit  [facility] [[id number]]  Level of Care: Telemetry Cardiac [103]  I expect the patient will be discharged within 24 hours: Yes  LOW acuity---Tx typically complete <24 hrs---ACUTE conditions typically can be evaluated <24 hours---LABS likely to return to acceptable levels <24 hours---IS near functional baseline---EXPECTED to return to current living arrangement---NOT newly hypoxic: Meets criteria for 5C-Observation unit  Covid Evaluation: N/A  Diagnosis: Unstable angina [facility](HCC) [[id number]]) [[id number]]  Admitting Physician: Rometta EmeryGARBA, MOHAMMAD L [[id number]]  Attending Physician: Rometta EmeryGARBA, MOHAMMAD L [[id number]]  PT Class (Do Not Modify): Observation [104]  PT Acc Code (Do Not Modify): Observation [10022]       B Medical/Surgery History Past Medical History:  Diagnosis Date  . Hypertension       A IV Location/Drains/Wounds Patient Lines/Drains/Airways Status   Active Line/Drains/Airways    None          Intake/Output Last 24 hours No intake or output data in the 24 hours ending 01/21/19 2014  Labs/Imaging Results for orders placed or performed during the hospital encounter of 01/21/19 (from the past 48 hour(s))  Basic metabolic panel     Status: None   Collection Time: 01/21/19  4:50 PM  Result  Value Ref Range   Sodium 139 135 - 145 mmol/L   Potassium 4.0 3.5 - 5.1 mmol/L   Chloride 104 98 - 111 mmol/L   CO2 23 22 - 32 mmol/L   Glucose, Bld 79 70 - 99 mg/dL   BUN 16 8 - 23 mg/dL   Creatinine, Ser 1.191.15 0.61 - 1.24 mg/dL   Calcium 9.6 8.9 - 14.710.3 mg/dL   GFR calc non Af Amer >60 >60 mL/min   GFR calc Af Amer >60 >60 mL/min   Anion gap 12 5 - 15    Comment: Performed at [facility], [address] [place], [place] [address]  CBC with Differential     Status: None   Collection Time: 01/21/19  4:50 PM  Result Value Ref Range   WBC 9.2 4.0 - 10.5 K/uL   RBC 5.68 4.22 - 5.81 MIL/uL   Hemoglobin 15.3 13.0 - 17.0 g/dL   HCT 21.347.9 08.639.0 - 57.852.0 %   MCV 84.3 80.0 - 100.0 fL   MCH 26.9 26.0 - 34.0 pg   MCHC 31.9 30.0 - 36.0 g/dL   RDW 46.913.1 62.911.5 - 52.815.5 %   Platelets 166 150 - 400 K/uL   nRBC 0.0 0.0 - 0.2 %   Neutrophils Relative % 52 %   Neutro Abs 4.9 1.7 - 7.7 K/uL   Lymphocytes Relative 37 %   Lymphs Abs 3.4 0.7 - 4.0 K/uL   Monocytes Relative 6 %   Monocytes Absolute 0.6 0.1 - 1.0 K/uL   Eosinophils Relative  4 %   Eosinophils Absolute 0.3 0.0 - 0.5 K/uL   Basophils Relative 1 %   Basophils Absolute 0.1 0.0 - 0.1 K/uL   Immature Granulocytes 0 %   Abs Immature Granulocytes 0.04 0.00 - 0.07 K/uL    Comment: Performed at [facility] [address], [place], [place] [address]  Troponin I - Once     Status: None   Collection Time: 01/21/19  4:50 PM  Result Value Ref Range   Troponin I <0.03 <0.03 ng/mL    Comment: Performed at [facility] [address] [place], [place] [address]   Dg Chest 2 View  Result Date: 01/21/2019 CLINICAL DATA:  Chest pain intermittently for 4 weeks. EXAM: CHEST - 2 VIEW COMPARISON:  None. FINDINGS: The heart size and mediastinal contours are within normal limits. Both lungs are clear. The visualized skeletal structures are unremarkable. IMPRESSION: No active cardiopulmonary disease. Electronically Signed   By: Dorise Bullion  III M.D   On: 01/21/2019 17:26    Pending Labs Unresulted Labs (From admission, onward)    Start     Ordered   01/23/19 0500  Heparin level (unfractionated)  Daily,   R     01/21/19 2006   01/22/19 0300  Heparin level (unfractionated)  Once-Timed,   R     01/21/19 2006   01/21/19 1947  Troponin I - Now Then Q3H  Now then every 3 hours,   TIMED     01/21/19 1949   01/21/19 1945  HIV antibody (Routine Testing)  Once,   R     01/21/19 1949   01/21/19 1936  SARS Coronavirus 2 (CEPHEID - Performed in [place] hospital lab), [facility] Order  Once,   R    Question:  Rule Out  Answer:  Yes   01/21/19 1935          Vitals/Pain Today's Vitals   01/21/19 1830 01/21/19 1845 01/21/19 1900 01/21/19 2000  BP: 106/69 103/69 110/82 120/90  Pulse: (!) 52 (!) 40 (!) 56   Resp: 15 (!) 22 11 18   Temp:      TempSrc:      SpO2: 92% 96% 96%   Weight:      Height:      PainSc:        Isolation Precautions No active isolations  Medications Medications  acetaminophen (TYLENOL) tablet 650 mg (650 mg Oral Refused 01/21/19 1700)  acetaminophen (TYLENOL) tablet 650 mg (has no administration in time range)  ondansetron (ZOFRAN) injection 4 mg (has no administration in time range)  nitroGLYCERIN (NITROSTAT) SL tablet 0.4 mg (has no administration in time range)  heparin ADULT infusion 100 units/mL (25000 units/244mL sodium chloride 0.45%) (has no administration in time range)  heparin bolus via infusion 4,000 Units (has no administration in time range)  nitroGLYCERIN (NITROGLYN) 2 % ointment 0.5 inch (has no administration in time range)    Mobility walks with person assist Low fall risk   Focused Assessments Cardiac Assessment Handoff:  Cardiac Rhythm: Sinus bradycardia Lab Results  Component Value Date   TROPONINI <0.03 01/21/2019   No results found for: DDIMER Does the Patient currently have chest pain? No     R Recommendations: See Admitting Provider Note  Report given to:    Additional Notes: N/A

## 2019-01-21 NOTE — Progress Notes (Signed)
NTICOAGULATION CONSULT NOTE - Initial  [place] for heparin Indication: chest pain/ACS  No Known Allergies  Patient Measurements: Height: 5\' 10"  (177.8 cm) Weight: 230 lb (104.3 kg) IBW/kg (Calculated) : 73 Heparin Dosing Weight: 95.2 kg  Vital Signs: Temp: 98.1 F (36.7 C) (06/08 1613) Temp Source: Oral (06/08 1613) BP: 110/82 (06/08 1900) Pulse Rate: 56 (06/08 1900)  Labs: Recent Labs    01/21/19 1650  HGB 15.3  HCT 47.9  PLT 166  CREATININE 1.15  TROPONINI <0.03    Estimated Creatinine Clearance: 80.5 mL/min (by C-G formula based on SCr of 1.15 mg/dL).   Medical History: Past Medical History:  Diagnosis Date  . Hypertension     Medications:  Scheduled:  . acetaminophen  650 mg Oral Once  . heparin  4,000 Units Intravenous Once  . nitroGLYCERIN  0.5 inch Topical Q6H    Assessment: 64 yom presenting with chest pain over weekend - no SOB. Confirmed with patient - no AC PTA.   Hgb 15.3, plt 166. Troponin <0.03. No s/sx of bleeding.   Goal of Therapy:  Heparin level 0.3-0.7 units/ml Monitor platelets by anticoagulation protocol: Yes   Plan:  Give 4000 units bolus x 1 Start heparin infusion at 1150 units/hr Check anti-Xa level in 6 hours and daily while on heparin Continue to monitor H&H and platelets  Antonietta Jewel, PharmD, BCCCP Clinical Pharmacist  Pager: [phone] Phone: [phone] 01/21/2019,8:02 PM

## 2019-01-21 NOTE — ED Provider Notes (Signed)
[place] EMERGENCY DEPARTMENT Provider Note   CSN: [id number] Arrival date & time: 01/21/19  1602    History   Chief Complaint Chief Complaint  Patient presents with  . Chest Pain    HPI Steve Orozco is a 63 y.o. male with past medical history of hypertension, hyperlipidemia, presenting to the emergency department with complaint of intermittent chest pain/tightness that has been occurring with exertion over the past 4 weeks.  Patient states when he is doing yard work for example, he begins feeling a chest tightness that radiates into both of his arms.  He feels short of breath with the chest pain as well.  His symptoms are improved with rest.  He states over the weekend he began having chest tightness at rest, last episode was today at his PCPs office.  He was given 324 mg of aspirin and nitroglycerin, he states this improved his symptoms, however the nitro is beginning to slightly wear off.  He has very minimal chest tightness at the time of evaluation.  He has no known heart disease, has never had a stress test.  Has never had chest pain like this before.  Denies associated nausea, diaphoresis, abdominal pain, fever, leg swelling, palpitations.  Patient reports family history of CAD.  Patient is a smoker.     The history is provided by the patient.    Past Medical History:  Diagnosis Date  . Hyperlipidemia   . Hypertension     Patient Active Problem List   Diagnosis Date Noted  . Unstable angina ([place]) 01/21/2019  . Benign essential HTN 01/21/2019  . Hyperlipidemia 01/21/2019    Past Surgical History:  Procedure Laterality Date  . HERNIA REPAIR          Home Medications    Prior to Admission medications   Not on File    Family History No family history on file.  Social History Social History   Tobacco Use  . Smoking status: Not on file  Substance Use Topics  . Alcohol use: Not on file  . Drug use: Not on file     Allergies    Patient has no known allergies.   Review of Systems Review of Systems  Constitutional: Negative for fever.  Respiratory: Positive for shortness of breath. Negative for cough.   Cardiovascular: Positive for chest pain. Negative for palpitations and leg swelling.  All other systems reviewed and are negative.    Physical Exam Updated Vital Signs BP 120/90   Pulse (!) 56   Temp 98.1 F (36.7 C) (Oral)   Resp 18   Ht 5\' 10"  (1.778 m)   Wt 104.3 kg   SpO2 96%   BMI 33.00 kg/m   Physical Exam Vitals signs and nursing note reviewed.  Constitutional:      General: He is not in acute distress.    Appearance: He is well-developed. He is not ill-appearing.  HENT:     Head: Normocephalic and atraumatic.  Eyes:     Conjunctiva/sclera: Conjunctivae normal.  Neck:     Musculoskeletal: Normal range of motion and neck supple.  Cardiovascular:     Rate and Rhythm: Normal rate and regular rhythm.  Pulmonary:     Effort: Pulmonary effort is normal. No respiratory distress.     Breath sounds: Normal breath sounds.  Abdominal:     General: Bowel sounds are normal.     Palpations: Abdomen is soft.     Tenderness: There is no abdominal tenderness.  There is no guarding.  Musculoskeletal:     Right lower leg: No edema.     Left lower leg: No edema.  Skin:    General: Skin is warm.  Neurological:     Mental Status: He is alert.  Psychiatric:        Behavior: Behavior normal.      ED Treatments / Results  Labs (all labs ordered are listed, but only abnormal results are displayed) Labs Reviewed  SARS CORONAVIRUS 2 (HOSPITAL ORDER, PERFORMED IN [facility])  BASIC METABOLIC PANEL  CBC WITH DIFFERENTIAL/PLATELET  TROPONIN I  HIV ANTIBODY (ROUTINE TESTING W REFLEX)  TROPONIN I  TROPONIN I  TROPONIN I  HEPARIN LEVEL (UNFRACTIONATED)    EKG EKG Interpretation  Date/Time:  Monday January 21 2019 16:20:01 EDT Ventricular Rate:  56 PR Interval:    QRS Duration: 100  QT Interval:  430 QTC Calculation: 415 R Axis:   57 Text Interpretation:  Sinus rhythm Borderline prolonged PR interval Confirmed by Loren RacerYelverton, David ([id number]) on 01/21/2019 6:43:08 PM   Radiology Dg Chest 2 View  Result Date: 01/21/2019 CLINICAL DATA:  Chest pain intermittently for 4 weeks. EXAM: CHEST - 2 VIEW COMPARISON:  None. FINDINGS: The heart size and mediastinal contours are within normal limits. Both lungs are clear. The visualized skeletal structures are unremarkable. IMPRESSION: No active cardiopulmonary disease. Electronically Signed   By: Gerome Samavid  Williams III M.D   On: 01/21/2019 17:26    Procedures Procedures (including critical care time)  Medications Ordered in ED Medications  acetaminophen (TYLENOL) tablet 650 mg (650 mg Oral Refused 01/21/19 1700)  acetaminophen (TYLENOL) tablet 650 mg (has no administration in time range)  ondansetron (ZOFRAN) injection 4 mg (has no administration in time range)  nitroGLYCERIN (NITROSTAT) SL tablet 0.4 mg (has no administration in time range)  heparin ADULT infusion 100 units/mL (25000 units/27450mL sodium chloride 0.45%) (has no administration in time range)  heparin bolus via infusion 4,000 Units (has no administration in time range)  nitroGLYCERIN (NITROGLYN) 2 % ointment 0.5 inch (has no administration in time range)     Initial Impression / Assessment and Plan / ED Course  I have reviewed the triage vital signs and the nursing notes.  Pertinent labs & imaging results that were available during my care of the patient were reviewed by me and considered in my medical decision making (see chart for details).  Clinical Course as of Jan 20 2033  Mon Jan 21, 2019  1918 Patient discussed with cardiology, Dr. Wyline MoodBranch.  Will see patient in the ED.  Recommends medical admission.   [JR]    Clinical Course User Index [JR] Gaurav Baldree, [place] N, PA-C      Patient presenting with symptoms consistent with unstable angina.  No previous cardiac  work-up or cardiologist.  He is chest pain-free in the ED. initial troponin, labs, EKG are reassuring.  Consult placed to cardiology, spoke with Dr. Wyline MoodBranch who recommends medical admission.  Dr. Mikeal HawthorneGarba with [facility] accepting admission.  Final Clinical Impressions(s) / ED Diagnoses   Final diagnoses:  Unstable angina [facility](HCC)    ED Discharge Orders    None       Thresia Ramanathan, [place] N, PA-C 01/21/19 2034    Loren RacerYelverton, David, MD 01/26/19 [id number]

## 2019-01-21 NOTE — ED Notes (Signed)
Nurse Navigator communication: daughters number Steve Orozco [phone]. A call with update to be made after results and assessment by EPD.

## 2019-01-21 NOTE — ED Notes (Signed)
Pt declines tylenol. Provider aware.

## 2019-01-22 ENCOUNTER — Encounter (HOSPITAL_COMMUNITY)
Admission: EM | Disposition: A | Discharge status: Home / Self Care | Payer: Self-pay | Source: Ambulatory Visit | Attending: Internal Medicine

## 2019-01-22 ENCOUNTER — Ambulatory Visit (HOSPITAL_BASED_OUTPATIENT_CLINIC_OR_DEPARTMENT_OTHER): Payer: BC Managed Care – PPO

## 2019-01-22 DIAGNOSIS — Z8249 Family history of ischemic heart disease and other diseases of the circulatory system: Secondary | ICD-10-CM | POA: Diagnosis not present

## 2019-01-22 DIAGNOSIS — E785 Hyperlipidemia, unspecified: Secondary | ICD-10-CM | POA: Diagnosis not present

## 2019-01-22 DIAGNOSIS — I25110 Atherosclerotic heart disease of native coronary artery with unstable angina pectoris: Principal | ICD-10-CM

## 2019-01-22 DIAGNOSIS — K219 Gastro-esophageal reflux disease without esophagitis: Secondary | ICD-10-CM | POA: Diagnosis present

## 2019-01-22 DIAGNOSIS — F17200 Nicotine dependence, unspecified, uncomplicated: Secondary | ICD-10-CM | POA: Diagnosis not present

## 2019-01-22 DIAGNOSIS — Z79899 Other long term (current) drug therapy: Secondary | ICD-10-CM | POA: Diagnosis not present

## 2019-01-22 DIAGNOSIS — F17210 Nicotine dependence, cigarettes, uncomplicated: Secondary | ICD-10-CM | POA: Diagnosis present

## 2019-01-22 DIAGNOSIS — Z6832 Body mass index (BMI) 32.0-32.9, adult: Secondary | ICD-10-CM | POA: Diagnosis not present

## 2019-01-22 DIAGNOSIS — R079 Chest pain, unspecified: Secondary | ICD-10-CM | POA: Diagnosis not present

## 2019-01-22 DIAGNOSIS — Z20828 Contact with and (suspected) exposure to other viral communicable diseases: Secondary | ICD-10-CM | POA: Diagnosis not present

## 2019-01-22 DIAGNOSIS — I200 Unstable angina: Secondary | ICD-10-CM | POA: Diagnosis not present

## 2019-01-22 DIAGNOSIS — E782 Mixed hyperlipidemia: Secondary | ICD-10-CM

## 2019-01-22 DIAGNOSIS — I10 Essential (primary) hypertension: Secondary | ICD-10-CM

## 2019-01-22 HISTORY — PX: LEFT HEART CATH AND CORONARY ANGIOGRAPHY: CATH118249

## 2019-01-22 HISTORY — PX: CORONARY STENT INTERVENTION: CATH118234

## 2019-01-22 LAB — POCT ACTIVATED CLOTTING TIME
Activated Clotting Time: 257 seconds
Activated Clotting Time: 307 seconds

## 2019-01-22 LAB — LIPID PANEL
Cholesterol: 183 mg/dL (ref 0–200)
HDL: 30 mg/dL — ABNORMAL LOW (ref >40)
LDL Cholesterol: 124 mg/dL — ABNORMAL HIGH (ref 0–99)
Total CHOL/HDL Ratio: 6.1 RATIO
Triglycerides: 144 mg/dL (ref <150)
VLDL: 29 mg/dL (ref 0–40)

## 2019-01-22 LAB — ECHOCARDIOGRAM COMPLETE
Height: 70 in
Weight: 3619.2 oz

## 2019-01-22 LAB — TROPONIN I
Troponin I: <0.03 ng/mL (ref <0.03)
Troponin I: <0.03 ng/mL (ref <0.03)

## 2019-01-22 LAB — HIV ANTIBODY (ROUTINE TESTING W REFLEX): HIV Screen 4th Generation wRfx: NONREACTIVE

## 2019-01-22 LAB — HEPARIN LEVEL (UNFRACTIONATED)
Heparin Unfractionated: 0.13 IU/mL — ABNORMAL LOW (ref 0.30–0.70)
Heparin Unfractionated: 0.36 IU/mL (ref 0.30–0.70)

## 2019-01-22 SURGERY — LEFT HEART CATH AND CORONARY ANGIOGRAPHY
Anesthesia: LOCAL

## 2019-01-22 MED ORDER — HEPARIN SODIUM (PORCINE) 1000 UNIT/ML IJ SOLN
INTRAMUSCULAR | Status: AC
  Filled 2019-01-22: qty 1

## 2019-01-22 MED ORDER — FENTANYL CITRATE (PF) 100 MCG/2ML IJ SOLN
INTRAMUSCULAR | Status: AC
  Filled 2019-01-22: qty 2

## 2019-01-22 MED ORDER — SODIUM CHLORIDE 0.9% FLUSH: 3.0000 mL | INTRAVENOUS | Status: DC | PRN

## 2019-01-22 MED ORDER — VERAPAMIL HCL 2.5 MG/ML IV SOLN
INTRAVENOUS | Status: AC
  Filled 2019-01-22: qty 2

## 2019-01-22 MED ORDER — HYDRALAZINE HCL 20 MG/ML IJ SOLN: 10.0000 mg | INTRAMUSCULAR | Status: AC | PRN

## 2019-01-22 MED ORDER — FENOFIBRATE 54 MG PO TABS
54.0000 mg | ORAL_TABLET | Freq: Every day | ORAL | Status: DC
  Administered 2019-01-22 – 2019-01-23 (×2): 54 mg via ORAL
  Filled 2019-01-22 (×2): qty 1

## 2019-01-22 MED ORDER — TICAGRELOR 90 MG PO TABS
ORAL_TABLET | ORAL | Status: AC
  Filled 2019-01-22: qty 1

## 2019-01-22 MED ORDER — LABETALOL HCL 5 MG/ML IV SOLN: 10.0000 mg | INTRAVENOUS | Status: AC | PRN

## 2019-01-22 MED ORDER — MIDAZOLAM HCL 2 MG/2ML IJ SOLN
INTRAMUSCULAR | Status: AC
  Filled 2019-01-22: qty 2

## 2019-01-22 MED ORDER — SODIUM CHLORIDE 0.9% FLUSH
3.0000 mL | Freq: Two times a day (BID) | INTRAVENOUS | Status: DC
  Administered 2019-01-22: 21:00:00 3 mL via INTRAVENOUS

## 2019-01-22 MED ORDER — TICAGRELOR 90 MG PO TABS
ORAL_TABLET | ORAL | Status: DC | PRN
  Administered 2019-01-22: 180 mg via ORAL

## 2019-01-22 MED ORDER — LISINOPRIL 20 MG PO TABS
20.0000 mg | ORAL_TABLET | Freq: Every day | ORAL | Status: DC
  Administered 2019-01-22 – 2019-01-23 (×2): 20 mg via ORAL
  Filled 2019-01-22 (×2): qty 1

## 2019-01-22 MED ORDER — LIDOCAINE HCL (PF) 1 % IJ SOLN
INTRAMUSCULAR | Status: DC | PRN
  Administered 2019-01-22: 2 mL

## 2019-01-22 MED ORDER — VERAPAMIL HCL 2.5 MG/ML IV SOLN
INTRAVENOUS | Status: DC | PRN
  Administered 2019-01-22: 14:00:00 10 mL via INTRA_ARTERIAL

## 2019-01-22 MED ORDER — SODIUM CHLORIDE 0.9% FLUSH
3.0000 mL | Freq: Two times a day (BID) | INTRAVENOUS | Status: DC
  Administered 2019-01-22 (×2): 3 mL via INTRAVENOUS

## 2019-01-22 MED ORDER — LIDOCAINE HCL (PF) 1 % IJ SOLN
INTRAMUSCULAR | Status: AC
  Filled 2019-01-22: qty 30

## 2019-01-22 MED ORDER — SODIUM CHLORIDE 0.9 % WEIGHT BASED INFUSION
1.0000 mL/kg/h | INTRAVENOUS | Status: DC
  Administered 2019-01-22: 1 mL/kg/h via INTRAVENOUS

## 2019-01-22 MED ORDER — ROSUVASTATIN CALCIUM 20 MG PO TABS
20.0000 mg | ORAL_TABLET | Freq: Every day | ORAL | Status: DC
  Administered 2019-01-22: 20 mg via ORAL
  Filled 2019-01-22: qty 1

## 2019-01-22 MED ORDER — ROSUVASTATIN CALCIUM 20 MG PO TABS
40.0000 mg | ORAL_TABLET | Freq: Every day | ORAL | Status: DC
  Administered 2019-01-23: 10:00:00 40 mg via ORAL
  Filled 2019-01-22: qty 2

## 2019-01-22 MED ORDER — HEPARIN SODIUM (PORCINE) 1000 UNIT/ML IJ SOLN
INTRAMUSCULAR | Status: DC | PRN
  Administered 2019-01-22: 5000 [IU] via INTRAVENOUS
  Administered 2019-01-22: 2000 [IU] via INTRAVENOUS
  Administered 2019-01-22: 2500 [IU] via INTRAVENOUS
  Administered 2019-01-22: 5000 [IU] via INTRAVENOUS

## 2019-01-22 MED ORDER — SODIUM CHLORIDE 0.9 % IV SOLN: 250.0000 mL | INTRAVENOUS | Status: DC | PRN

## 2019-01-22 MED ORDER — ACETAMINOPHEN 325 MG PO TABS: 650.0000 mg | ORAL_TABLET | ORAL | Status: DC | PRN

## 2019-01-22 MED ORDER — HEPARIN (PORCINE) IN NACL 1000-0.9 UT/500ML-% IV SOLN
INTRAVENOUS | Status: DC | PRN
  Administered 2019-01-22 (×2): 500 mL

## 2019-01-22 MED ORDER — OXYCODONE HCL 5 MG PO TABS: 5.0000 mg | ORAL_TABLET | ORAL | Status: DC | PRN

## 2019-01-22 MED ORDER — ASPIRIN 81 MG PO CHEW
81.0000 mg | CHEWABLE_TABLET | ORAL | Status: AC
  Administered 2019-01-22: 81 mg via ORAL
  Filled 2019-01-22: qty 1

## 2019-01-22 MED ORDER — PANTOPRAZOLE SODIUM 40 MG PO TBEC
40.0000 mg | DELAYED_RELEASE_TABLET | Freq: Every day | ORAL | Status: DC
  Administered 2019-01-22 – 2019-01-23 (×2): 40 mg via ORAL
  Filled 2019-01-22 (×2): qty 1

## 2019-01-22 MED ORDER — ASPIRIN 81 MG PO CHEW
81.0000 mg | CHEWABLE_TABLET | Freq: Every day | ORAL | Status: DC
  Administered 2019-01-23: 81 mg via ORAL
  Filled 2019-01-22: qty 1

## 2019-01-22 MED ORDER — SODIUM CHLORIDE 0.9 % WEIGHT BASED INFUSION: 3.0000 mL/kg/h | INTRAVENOUS | Status: DC

## 2019-01-22 MED ORDER — IOHEXOL 350 MG/ML SOLN
INTRAVENOUS | Status: DC | PRN
  Administered 2019-01-22: 16:00:00 250 mL via INTRA_ARTERIAL

## 2019-01-22 MED ORDER — ENOXAPARIN SODIUM 40 MG/0.4ML ~~LOC~~ SOLN: 40.0000 mg | SUBCUTANEOUS | Status: DC

## 2019-01-22 MED ORDER — HEPARIN (PORCINE) IN NACL 1000-0.9 UT/500ML-% IV SOLN
INTRAVENOUS | Status: AC
  Filled 2019-01-22: qty 1000

## 2019-01-22 MED ORDER — ONDANSETRON HCL 4 MG/2ML IJ SOLN: 4.0000 mg | Freq: Four times a day (QID) | INTRAMUSCULAR | Status: DC | PRN

## 2019-01-22 MED ORDER — HEPARIN BOLUS VIA INFUSION
3000.0000 [IU] | Freq: Once | INTRAVENOUS | Status: AC
  Administered 2019-01-22: 3000 [IU] via INTRAVENOUS
  Filled 2019-01-22: qty 3000

## 2019-01-22 MED ORDER — SODIUM CHLORIDE 0.9 % IV SOLN
INTRAVENOUS | Status: AC
  Administered 2019-01-22: 19:00:00 via INTRAVENOUS

## 2019-01-22 MED ORDER — TICAGRELOR 90 MG PO TABS
90.0000 mg | ORAL_TABLET | Freq: Two times a day (BID) | ORAL | Status: DC
  Administered 2019-01-22 – 2019-01-23 (×2): 90 mg via ORAL
  Filled 2019-01-22 (×2): qty 1

## 2019-01-22 MED ORDER — FENTANYL CITRATE (PF) 100 MCG/2ML IJ SOLN
INTRAMUSCULAR | Status: DC | PRN
  Administered 2019-01-22 (×3): 25 ug via INTRAVENOUS

## 2019-01-22 MED ORDER — MIDAZOLAM HCL 2 MG/2ML IJ SOLN
INTRAMUSCULAR | Status: DC | PRN
  Administered 2019-01-22: 0.5 mg via INTRAVENOUS
  Administered 2019-01-22 (×2): 1 mg via INTRAVENOUS

## 2019-01-22 SURGICAL SUPPLY — 19 items
BALLN SAPPHIRE 3.0X12 (BALLOONS) ×2
BALLN SAPPHIRE ~~LOC~~ 3.75X12 (BALLOONS) ×1 IMPLANT
BALLOON SAPPHIRE 3.0X12 (BALLOONS) IMPLANT
CATH 5FR JL3.5 JR4 ANG PIG MP (CATHETERS) ×1 IMPLANT
CATH VISTA GUIDE 6FR XBLAD3.5 (CATHETERS) ×1 IMPLANT
DEVICE RAD COMP TR BAND LRG (VASCULAR PRODUCTS) ×1 IMPLANT
GLIDESHEATH SLEND A-KIT 6F 22G (SHEATH) ×1 IMPLANT
GUIDEWIRE INQWIRE 1.5J.035X260 (WIRE) IMPLANT
INQWIRE 1.5J .035X260CM (WIRE) ×2
KIT ENCORE 26 ADVANTAGE (KITS) ×1 IMPLANT
KIT HEART LEFT (KITS) ×2 IMPLANT
PACK CARDIAC CATHETERIZATION (CUSTOM PROCEDURE TRAY) ×2 IMPLANT
SHEATH PROBE COVER 6X72 (BAG) ×1 IMPLANT
STENT RESOLUTE ONYX 3.0X12 (Permanent Stent) ×1 IMPLANT
STENT RESOLUTE ONYX 3.0X15 (Permanent Stent) ×1 IMPLANT
TRANSDUCER W/STOPCOCK (MISCELLANEOUS) ×2 IMPLANT
TUBING CIL FLEX 10 FLL-RA (TUBING) ×2 IMPLANT
WIRE ASAHI PROWATER 180CM (WIRE) ×1 IMPLANT
WIRE COUGAR XT STRL 190CM (WIRE) ×1 IMPLANT

## 2019-01-22 NOTE — CV Procedure (Signed)
   Coronary angiography, left heart catheterization, LV gram, and LAD stent.  Right radial access via ultrasound guidance.  Normal left main, eccentric 85% proximal to mid LAD forming a [place] 110 bifurcation stenosis with a moderate size diagonal.  Circumflex is large and free of disease, and the right coronary contains irregularities but no high-grade obstruction.  Mild mid anterior hypokinesis.  EF 55%.  LV end-diastolic pressure less than 15.  Successful LAD stent proximal to mid LAD using overlapping 3.0 Onyx postdilated to 3.75 mm with TIMI grade III flow.  Single stent technique crossing over diagonal without loss of flow.  2 stents were required because there was geographic miss with the first stent as the patient take a deep breath instantaneous with initiation of balloon inflation causing the stent to miss the lesion proximally.  The second stent overlapped the distal margin of the first stent and treated the culprit site.  Discharge in a.m. if no issues.  Aspirin and Brilinta x 12 months.  Have intensified statin dose.

## 2019-01-22 NOTE — Progress Notes (Signed)
Pt leaves cath lab holding area in stable condition. No chest pain or SOB. Rt radial site is unremarkable.

## 2019-01-22 NOTE — Interval H&P Note (Signed)
Cath Lab Visit (complete for each Cath Lab visit)  Clinical Evaluation Leading to the Procedure:   ACS: Yes.    Non-ACS:    Anginal Classification: CCS Orozco  Anti-ischemic medical therapy: Maximal Therapy (2 or more classes of medications)  Non-Invasive Test Results: No non-invasive testing performed  Prior CABG: No previous CABG      History and Physical Interval Note:  01/22/2019 9:12 AM  Steve Orozco  has presented today for surgery, with the diagnosis of unstable angina.  The various methods of treatment have been discussed with the patient and family. After consideration of risks, benefits and other options for treatment, the patient has consented to  Procedure(s): LEFT HEART CATH AND CORONARY ANGIOGRAPHY (N/A) as a surgical intervention.  The patient's history has been reviewed, patient examined, no change in status, stable for surgery.  I have reviewed the patient's chart and labs.  Questions were answered to the patient's satisfaction.     Steve Orozco

## 2019-01-22 NOTE — Progress Notes (Signed)
[facility] PROGRESS NOTE  Steve Orozco MLY:[id number] DOB: Aug 24, 1955 DOA: 01/21/2019 PCP: Rochel Brome, MD  Assessment/Plan:   #1  Unstable angina: no further chest pain. Heart score 6.  Evaluated by cardiology who opine classic hx of exertional angina for 2 weeks. EKG without acute changes, tropon 0.03 x2. Heparin gtt initiated.  Awaiting echo results. Pain free this am -continue npo for cath -appreciated cards assistance  #2 hypertension: BP low end of normal. Home meds include lisinopril.  -continue home meds -monitor closely  #3 hyperlipidemia: On fenofibrate and Crestor. -obtain lipid panel - Continue  #4 GERD: Continue with PPIs  #5. Tobacco use.  -cessation counseling offered  Code Status: full Family Communication:  Disposition Plan: home hopefully tomorro   Consultants:  [facility] cardiology  [facility] cardiology  Procedures:  Cath 01/22/19  Antibiotics:   HPI/Subjective: Admitted for unstable angina. Pain free this am. Cath today.   Sitting on side of bed. No acute distress.   Objective: Vitals:   01/22/19 0514 01/22/19 0716  BP: 110/69 101/67  Pulse: 62 (!) 56  Resp: 18 20  Temp: 98 F (36.7 C) 98.5 F (36.9 C)  SpO2: 97% 94%    Intake/Output Summary (Last 24 hours) at 01/22/2019 0918 Last data filed at 01/22/2019 0718 Gross per 24 hour  Intake 68.23 ml  Output 950 ml  Net -881.77 ml   Filed Weights   01/21/19 1654 01/21/19 2206 01/22/19 0514  Weight: 104.3 kg 103.2 kg 102.6 kg    Exam:   General:  Awake alert no acute distress  Cardiovascular: rrr no mgr no LE edema  Respiratory: normal effort BS clear bilaterally no wheeze  Abdomen: obese soft +BS no guarding or rebounding  Musculoskeletal: joints without swelling/erythema   Data Reviewed: Basic Metabolic Panel: Recent Labs  Lab 01/21/19 1650  NA 139  K 4.0  CL 104  CO2 23  GLUCOSE 79  BUN 16  CREATININE 1.15  CALCIUM 9.6   Liver Function Tests: No  results for input(s): AST, ALT, ALKPHOS, BILITOT, PROT, ALBUMIN in the last 168 hours. No results for input(s): LIPASE, AMYLASE in the last 168 hours. No results for input(s): AMMONIA in the last 168 hours. CBC: Recent Labs  Lab 01/21/19 1650  WBC 9.2  NEUTROABS 4.9  HGB 15.3  HCT 47.9  MCV 84.3  PLT 166   Cardiac Enzymes: Recent Labs  Lab 01/21/19 1650 01/21/19 2200 01/22/19 0314  TROPONINI <0.03 <0.03 <0.03   BNP (last 3 results) No results for input(s): BNP in the last 8760 hours.  ProBNP (last 3 results) No results for input(s): PROBNP in the last 8760 hours.  CBG: No results for input(s): GLUCAP in the last 168 hours.  Recent Results (from the past 240 hour(s))  SARS Coronavirus 2 (CEPHEID - Performed in [place] hospital lab), Hosp Order     Status: None   Collection Time: 01/21/19  8:55 PM  Result Value Ref Range Status   SARS Coronavirus 2 NEGATIVE NEGATIVE Final    Comment: (NOTE) If result is NEGATIVE SARS-CoV-2 target nucleic acids are NOT DETECTED. The SARS-CoV-2 RNA is generally detectable in upper and lower  respiratory specimens during the acute phase of infection. The lowest  concentration of SARS-CoV-2 viral copies this assay can detect is 250  copies / mL. A negative result does not preclude SARS-CoV-2 infection  and should not be used as the sole basis for treatment or other  patient management decisions.  A negative result may  occur with  improper specimen collection / handling, submission of specimen other  than nasopharyngeal swab, presence of viral mutation(s) within the  areas targeted by this assay, and inadequate number of viral copies  (<250 copies / mL). A negative result must be combined with clinical  observations, patient history, and epidemiological information. If result is POSITIVE SARS-CoV-2 target nucleic acids are DETECTED. The SARS-CoV-2 RNA is generally detectable in upper and lower  respiratory specimens dur ing the  acute phase of infection.  Positive  results are indicative of active infection with SARS-CoV-2.  Clinical  correlation with patient history and other diagnostic information is  necessary to determine patient infection status.  Positive results do  not rule out bacterial infection or co-infection with other viruses. If result is PRESUMPTIVE POSTIVE SARS-CoV-2 nucleic acids MAY BE PRESENT.   A presumptive positive result was obtained on the submitted specimen  and confirmed on repeat testing.  While 2019 novel coronavirus  (SARS-CoV-2) nucleic acids may be present in the submitted sample  additional confirmatory testing may be necessary for epidemiological  and / or clinical management purposes  to differentiate between  SARS-CoV-2 and other Sarbecovirus currently known to infect humans.  If clinically indicated additional testing with an alternate test  methodology [phone] is advised. The SARS-CoV-2 RNA is generally  detectable in upper and lower respiratory sp ecimens during the acute  phase of infection. The expected result is Negative. Fact Sheet for Patients:  BoilerBrush.com.cyhttps://www.fda.gov/media/136312/download Fact Sheet for Healthcare Providers: https://pope.com/https://www.fda.gov/media/136313/download This test is not yet approved or cleared by the [place] FDA and has been authorized for detection and/or diagnosis of SARS-CoV-2 by FDA under an Emergency Use Authorization (EUA).  This EUA will remain in effect (meaning this test can be used) for the duration of the COVID-19 declaration under Section 564(b)(1) of the Act, 21 U.S.C. section 360bbb-3(b)(1), unless the authorization is terminated or revoked sooner. Performed at [facility], [address] [place], [place] [address]      Studies: Dg Chest 2 View  Result Date: 01/21/2019 CLINICAL DATA:  Chest pain intermittently for 4 weeks. EXAM: CHEST - 2 VIEW COMPARISON:  None. FINDINGS: The heart size and mediastinal contours are within  normal limits. Both lungs are clear. The visualized skeletal structures are unremarkable. IMPRESSION: No active cardiopulmonary disease. Electronically Signed   By: Gerome Samavid  Williams III M.D   On: 01/21/2019 17:26    Scheduled Meds: . acetaminophen  650 mg Oral Once  . aspirin  81 mg Oral Pre-Cath  . fenofibrate  54 mg Oral Daily  . lisinopril  20 mg Oral Daily  . nitroGLYCERIN  0.5 inch Topical Q6H  . pantoprazole  40 mg Oral Daily  . rosuvastatin  20 mg Oral Daily  . sodium chloride flush  3 mL Intravenous Q12H   Continuous Infusions: . sodium chloride     Followed by  . sodium chloride    . heparin 1,400 Units/hr (01/22/19 0432)    Principal Problem:   Unstable angina (HCC) Active Problems:   Benign essential HTN   Hyperlipidemia   Tobacco use disorder    Time spent: 45 minutes    [facility] Memorial HospitalBLACK,Yoni Lobos M NP [facility]  If 7PM-7AM, please contact night-coverage at [email or web address], password [email or web address] 01/22/2019, 9:18 AM  LOS: 0 days

## 2019-01-22 NOTE — H&P (View-Only) (Signed)
    Subjective:  Denies SSCP, palpitations or Dyspnea Describes classic history of exertional angina for 2 weeks Please call his daughters father in law post cath Dr Shelbie Ammons [phone] who is an interventionalist At [facility] in [place]  Objective:  Vitals:   01/21/19 2206 01/22/19 0029 01/22/19 0514 01/22/19 0716  BP: 109/83 92/63 110/69 101/67  Pulse: 65 66 62 (!) 56  Resp:  18 18 20   Temp: 98.3 F (36.8 C) 98 F (36.7 C) 98 F (36.7 C) 98.5 F (36.9 C)  TempSrc: Oral Oral  Oral  SpO2: 95% 96% 97% 94%  Weight: 103.2 kg  102.6 kg   Height: 5\' 10"  (1.778 m)       Intake/Output from previous day:  Intake/Output Summary (Last 24 hours) at 01/22/2019 0845 Last data filed at 01/22/2019 9371 Gross per 24 hour  Intake 68.23 ml  Output 950 ml  Net -881.77 ml    Physical Exam: Affect appropriate Healthy:  appears stated age HEENT: normal Neck supple with no adenopathy JVP normal no bruits no thyromegaly Lungs clear with no wheezing and good diaphragmatic motion Heart:  S1/S2 no murmur, no rub, gallop or click PMI normal Abdomen: benighn, BS positve, no tenderness, no AAA no bruit.  No HSM or HJR Distal pulses intact with no bruits No edema Neuro non-focal Skin warm and dry No muscular weakness   Lab Results: Basic Metabolic Panel: Recent Labs    01/21/19 1650  NA 139  K 4.0  CL 104  CO2 23  GLUCOSE 79  BUN 16  CREATININE 1.15  CALCIUM 9.6   Liver Function Tests: No results for input(s): AST, ALT, ALKPHOS, BILITOT, PROT, ALBUMIN in the last 72 hours. No results for input(s): LIPASE, AMYLASE in the last 72 hours. CBC: Recent Labs    01/21/19 1650  WBC 9.2  NEUTROABS 4.9  HGB 15.3  HCT 47.9  MCV 84.3  PLT 166   Cardiac Enzymes: Recent Labs    01/21/19 1650 01/21/19 2200 01/22/19 0314  TROPONINI <0.03 <0.03 <0.03    Imaging: Dg Chest 2 View  Result Date: 01/21/2019 CLINICAL DATA:  Chest pain intermittently for 4 weeks. EXAM:  CHEST - 2 VIEW COMPARISON:  None. FINDINGS: The heart size and mediastinal contours are within normal limits. Both lungs are clear. The visualized skeletal structures are unremarkable. IMPRESSION: No active cardiopulmonary disease. Electronically Signed   By: Dorise Bullion III M.D   On: 01/21/2019 17:26    Cardiac Studies:  ECG:  NSR normal    Telemetry:  NSR no arrhythmia   Echo: pending   Medications:   . acetaminophen  650 mg Oral Once  . fenofibrate  54 mg Oral Daily  . lisinopril  20 mg Oral Daily  . nitroGLYCERIN  0.5 inch Topical Q6H  . pantoprazole  40 mg Oral Daily  . rosuvastatin  20 mg Oral Daily     . heparin 1,400 Units/hr (01/22/19 0432)    Assessment/Plan:   Angina:  decribes classic angina for 2 weeks risks of cath including stroke MI, bleeding contrast reaction and need for emergency CABG discussed willing to proceed. Orders written To be done by PJ/HS latter today continue heparin   HTN:  Well controlled.  Continue current medications and low sodium Dash type diet.    HLD  On statin    Jenkins Rouge 01/22/2019, 8:45 AM

## 2019-01-22 NOTE — Progress Notes (Signed)
  Echocardiogram 2D Echocardiogram has been performed.  Steve Orozco 01/22/2019, 9:44 AM

## 2019-01-22 NOTE — Progress Notes (Signed)
NTICOAGULATION CONSULT NOTE - follow up Pharmacy Consult for heparin Indication: chest pain/ACS  No Known Allergies  Patient Measurements: Height: 5\' 10"  (177.8 cm) Weight: 227 lb 8.2 oz (103.2 kg)(scale a) IBW/kg (Calculated) : 73 Heparin Dosing Weight: 95.2 kg  Vital Signs: Temp: 98 F (36.7 C) (06/09 0029) Temp Source: Oral (06/09 0029) BP: 92/63 (06/09 0029) Pulse Rate: 66 (06/09 0029)  Labs: Recent Labs    01/21/19 1650 01/21/19 2200 01/22/19 0314  HGB 15.3  --   --   HCT 47.9  --   --   PLT 166  --   --   HEPARINUNFRC  --   --  0.13*  CREATININE 1.15  --   --   TROPONINI <0.03 <0.03  --     Estimated Creatinine Clearance: 80.2 mL/min (by C-G formula based on SCr of 1.15 mg/dL).   Medical History: Past Medical History:  Diagnosis Date  . Hyperlipidemia   . Hypertension     Medications:  Scheduled:  . acetaminophen  650 mg Oral Once  . fenofibrate  54 mg Oral Daily  . lisinopril  20 mg Oral Daily  . nitroGLYCERIN  0.5 inch Topical Q6H  . pantoprazole  40 mg Oral Daily  . rosuvastatin  20 mg Oral Daily    Assessment: 11 yom presenting with chest pain over weekend - no SOB. Confirmed with patient - no AC PTA.   Admission labs: Hgb 15.3, plt 166. Troponin <0.03.   Initial 6 hour heparin level is 0.13, subtherapeutic on heparin rate of 1150 units/hr. No issues or interruptions with heparin infusion per RN's report.  No s/sx of bleeding.   Goal of Therapy:  Heparin level 0.3-0.7 units/ml Monitor platelets by anticoagulation protocol: Yes   Plan:  Give heparin 3000 units IV bolus x1 Increase heparin drip rate to 1400 units/hr Check heparin level 6 hours after bolus & rate increase.  Daily HL and CBC    Nicole Cella, RPh Clinical Pharmacist Please check AMION for all [place] phone numbers After 10:00 PM, call [place] [phone] 01/22/2019,4:11 AM

## 2019-01-22 NOTE — Progress Notes (Signed)
NTICOAGULATION CONSULT NOTE - Follow Up Pharmacy Consult for heparin Indication: chest pain/ACS  No Known Allergies  Patient Measurements: Height: 5\' 10"  (177.8 cm) Weight: 226 lb 3.2 oz (102.6 kg)(scale a) IBW/kg (Calculated) : 73 Heparin Dosing Weight: 95.2 kg  Vital Signs: Temp: 98.1 F (36.7 C) (06/09 1101) Temp Source: Oral (06/09 1101) BP: 113/73 (06/09 1101) Pulse Rate: 51 (06/09 1101)  Labs: Recent Labs    01/21/19 1650 01/21/19 2200 01/22/19 0314 01/22/19 0933  HGB 15.3  --   --   --   HCT 47.9  --   --   --   PLT 166  --   --   --   HEPARINUNFRC  --   --  0.13* 0.36  CREATININE 1.15  --   --   --   TROPONINI <0.03 <0.03 <0.03 <0.03    Estimated Creatinine Clearance: 79.9 mL/min (by C-G formula based on SCr of 1.15 mg/dL).   Medical History: Past Medical History:  Diagnosis Date  . Hyperlipidemia   . Hypertension     Medications:  Scheduled:  . acetaminophen  650 mg Oral Once  . fenofibrate  54 mg Oral Daily  . lisinopril  20 mg Oral Daily  . nitroGLYCERIN  0.5 inch Topical Q6H  . pantoprazole  40 mg Oral Daily  . rosuvastatin  20 mg Oral Daily  . sodium chloride flush  3 mL Intravenous Q12H    Assessment: 58 yoM presenting with CP started on IV heparin for ACS. No OAC noted prior to admission. Heparin level therapeutic this morning, cath planned today so will defer confirmatory heparin level. No CBC ordered, no S/Sx bleeding documented.  Goal of Therapy:  Heparin level 0.3-0.7 units/ml Monitor platelets by anticoagulation protocol: Yes   Plan:  -Continue heparin 1400 units/hr -Follow up after cath   Arrie Senate, PharmD, BCPS Clinical Pharmacist [phone] Please check AMION for all [place] numbers 01/22/2019

## 2019-01-22 NOTE — Progress Notes (Signed)
    Subjective:  Denies SSCP, palpitations or Dyspnea Describes classic history of exertional angina for 2 weeks Please call his daughters father in law post cath Dr Charlie Brown [phone] who is an interventionalist At [facility] in [place]  Objective:  Vitals:   01/21/19 2206 01/22/19 0029 01/22/19 0514 01/22/19 0716  BP: 109/83 92/63 110/69 101/67  Pulse: 65 66 62 (!) 56  Resp:  18 18 20  Temp: 98.3 F (36.8 C) 98 F (36.7 C) 98 F (36.7 C) 98.5 F (36.9 C)  TempSrc: Oral Oral  Oral  SpO2: 95% 96% 97% 94%  Weight: 103.2 kg  102.6 kg   Height: 5' 10" (1.778 m)       Intake/Output from previous day:  Intake/Output Summary (Last 24 hours) at 01/22/2019 0845 Last data filed at 01/22/2019 0718 Gross per 24 hour  Intake 68.23 ml  Output 950 ml  Net -881.77 ml    Physical Exam: Affect appropriate Healthy:  appears stated age HEENT: normal Neck supple with no adenopathy JVP normal no bruits no thyromegaly Lungs clear with no wheezing and good diaphragmatic motion Heart:  S1/S2 no murmur, no rub, gallop or click PMI normal Abdomen: benighn, BS positve, no tenderness, no AAA no bruit.  No HSM or HJR Distal pulses intact with no bruits No edema Neuro non-focal Skin warm and dry No muscular weakness   Lab Results: Basic Metabolic Panel: Recent Labs    01/21/19 1650  NA 139  K 4.0  CL 104  CO2 23  GLUCOSE 79  BUN 16  CREATININE 1.15  CALCIUM 9.6   Liver Function Tests: No results for input(s): AST, ALT, ALKPHOS, BILITOT, PROT, ALBUMIN in the last 72 hours. No results for input(s): LIPASE, AMYLASE in the last 72 hours. CBC: Recent Labs    01/21/19 1650  WBC 9.2  NEUTROABS 4.9  HGB 15.3  HCT 47.9  MCV 84.3  PLT 166   Cardiac Enzymes: Recent Labs    01/21/19 1650 01/21/19 2200 01/22/19 0314  TROPONINI <0.03 <0.03 <0.03    Imaging: Dg Chest 2 View  Result Date: 01/21/2019 CLINICAL DATA:  Chest pain intermittently for 4 weeks. EXAM:  CHEST - 2 VIEW COMPARISON:  None. FINDINGS: The heart size and mediastinal contours are within normal limits. Both lungs are clear. The visualized skeletal structures are unremarkable. IMPRESSION: No active cardiopulmonary disease. Electronically Signed   By: David  Williams III M.D   On: 01/21/2019 17:26    Cardiac Studies:  ECG:  NSR normal    Telemetry:  NSR no arrhythmia   Echo: pending   Medications:   . acetaminophen  650 mg Oral Once  . fenofibrate  54 mg Oral Daily  . lisinopril  20 mg Oral Daily  . nitroGLYCERIN  0.5 inch Topical Q6H  . pantoprazole  40 mg Oral Daily  . rosuvastatin  20 mg Oral Daily     . heparin 1,400 Units/hr (01/22/19 0432)    Assessment/Plan:   Angina:  decribes classic angina for 2 weeks risks of cath including stroke MI, bleeding contrast reaction and need for emergency CABG discussed willing to proceed. Orders written To be done by PJ/HS latter today continue heparin   HTN:  Well controlled.  Continue current medications and low sodium Dash type diet.    HLD  On statin    Brigit Doke 01/22/2019, 8:45 AM    

## 2019-01-23 ENCOUNTER — Encounter (HOSPITAL_COMMUNITY): Payer: Self-pay | Admitting: Interventional Cardiology

## 2019-01-23 DIAGNOSIS — E785 Hyperlipidemia, unspecified: Secondary | ICD-10-CM

## 2019-01-23 LAB — CBC
HCT: 47.3 % (ref 39.0–52.0)
Hemoglobin: 15.6 g/dL (ref 13.0–17.0)
MCH: 27.2 pg (ref 26.0–34.0)
MCHC: 33 g/dL (ref 30.0–36.0)
MCV: 82.4 fL (ref 80.0–100.0)
Platelets: 161 10*3/uL (ref 150–400)
RBC: 5.74 MIL/uL (ref 4.22–5.81)
RDW: 13 % (ref 11.5–15.5)
WBC: 7.9 10*3/uL (ref 4.0–10.5)
nRBC: 0 % (ref 0.0–0.2)

## 2019-01-23 LAB — BASIC METABOLIC PANEL
Anion gap: 10 (ref 5–15)
BUN: 11 mg/dL (ref 8–23)
CO2: 22 mmol/L (ref 22–32)
Calcium: 9.5 mg/dL (ref 8.9–10.3)
Chloride: 108 mmol/L (ref 98–111)
Creatinine, Ser: 1.16 mg/dL (ref 0.61–1.24)
GFR calc Af Amer: >60 mL/min (ref >60)
GFR calc non Af Amer: >60 mL/min (ref >60)
Glucose, Bld: 101 mg/dL — ABNORMAL HIGH (ref 70–99)
Potassium: 3.9 mmol/L (ref 3.5–5.1)
Sodium: 140 mmol/L (ref 135–145)

## 2019-01-23 MED ORDER — ROSUVASTATIN CALCIUM 40 MG PO TABS: 40.0000 mg | ORAL_TABLET | Freq: Every day | ORAL | 0 refills | Status: DC

## 2019-01-23 MED ORDER — ASPIRIN 81 MG PO CHEW: 81.0000 mg | CHEWABLE_TABLET | Freq: Every day | ORAL | 0 refills | Status: AC

## 2019-01-23 MED ORDER — TICAGRELOR 90 MG PO TABS: 90.0000 mg | ORAL_TABLET | Freq: Two times a day (BID) | ORAL | 0 refills | Status: DC

## 2019-01-23 MED FILL — ROSUVASTATIN CALCIUM 40 MG: 40 | 30 days supply | Qty: 30 | Fill #0

## 2019-01-23 MED FILL — ASPIRIN LOW DOSE 81 MG CHEW: 81 | 30 days supply | Qty: 30 | Fill #0

## 2019-01-23 MED FILL — BRILINTA 90 MG TABLET: 90 | 30 days supply | Qty: 60 | Fill #0

## 2019-01-23 NOTE — Progress Notes (Signed)
CARDIAC REHAB PHASE I   PRE:  Rate/Rhythm: 47 SB  BP:  Sitting: 112/77      SaO2: 97 RA  MODE:  Ambulation: 470 ft   POST:  Rate/Rhythm: 68 SR  BP:  Sitting: 120/75    SaO2: 100 RA   Pt ambulated 424ft in hallway independently. Pt denies CP or SOB. Pt educated on importance of ASA, Brilinta, and NTG. Pt given heart healthy diet, and smoking cessation tip sheet. Pt states he is not interested in quitting at this time. Reviewed restrictions and exercise guidelines. Will refer to CRP II [employer], pt does not think he will have time to attend though.  [id number] Rufina Falco, RN BSN 01/23/2019 9:18 AM

## 2019-01-23 NOTE — Discharge Instructions (Addendum)
Steve Orozco,  You were admitted with chest pain and underwent stent placement for some heart disease. You will need to be on antiplatelet therapy (Aspirin and Brilinta). Please do not stop these medications as this could cause serious issues after stent placement. Please call your cardiologist with any complications with regard to your stent or bleeding issues while on these medications.

## 2019-01-23 NOTE — TOC Benefit Eligibility Note (Signed)
Transition of Care [facility]) Benefit Eligibility Note    Patient Details  Name: Steve Orozco MRN: [id number] Date of Birth: 10-29-1955   Medication/Dose: Kary Kos  90 MG BID(TICAGRELOR-NON-FORMULARY)  Covered?: Yes  Tier: 2 Drug     Spoke with Person/Company/Phone Number:: [employer] # [phone])  Co-Pay: $ 35.00  Prior Approval: No          Memory Argue Phone Number: 01/23/2019, 11:06 AM

## 2019-01-23 NOTE — Progress Notes (Signed)
Progress Note  Patient Name: Steve Orozco Date of Encounter: 01/23/2019  Primary Cardiologist: Harl Bowie   Subjective   No significant overnight events.   Inpatient Medications    Scheduled Meds: . acetaminophen  650 mg Oral Once  . aspirin  81 mg Oral Daily  . enoxaparin (LOVENOX) injection  40 mg Subcutaneous Q24H  . fenofibrate  54 mg Oral Daily  . lisinopril  20 mg Oral Daily  . pantoprazole  40 mg Oral Daily  . rosuvastatin  40 mg Oral Daily  . sodium chloride flush  3 mL Intravenous Q12H  . sodium chloride flush  3 mL Intravenous Q12H  . ticagrelor  90 mg Oral BID   Continuous Infusions: . sodium chloride     PRN Meds: sodium chloride, acetaminophen, nitroGLYCERIN, ondansetron (ZOFRAN) IV, oxyCODONE, sodium chloride flush   Vital Signs    Vitals:   01/22/19 1823 01/22/19 1854 01/22/19 2005 01/23/19 0615  BP: 112/79 106/80 99/73 118/88  Pulse: 62 (!) 56 (!) 55 (!) 57  Resp: 14 12 12 18   Temp:   97.7 F (36.5 C)   TempSrc:   Oral   SpO2: 98% 98% 97% 99%  Weight:    101.8 kg  Height:        Intake/Output Summary (Last 24 hours) at 01/23/2019 0850 Last data filed at 01/23/2019 0616 Gross per 24 hour  Intake 779.25 ml  Output 1410 ml  Net -630.75 ml   Filed Weights   01/21/19 2206 01/22/19 0514 01/23/19 0615  Weight: 103.2 kg 102.6 kg 101.8 kg    Telemetry    Sinus rhythm with rates ranging from the 40's to 90's most mostly in the 50's to 60's. - Personally Reviewed  ECG    Sinus bradycardia, rate 52 bpm, with some T wave flattening in lead V3 but no significant changes from prior tracings.  - Personally Reviewed  Physical Exam   GEN:@ male  resting comfortably. Alert and in no acute distress.   Neck: Supple. No JVD. Cardiac: RRR. No murmurs, gallops, or rubs.  Respiratory: Clear to auscultation bilaterally. No wheezes, rhonchi, or rales. GI: Abdomen soft, non-distended, and non-tender. Bowel sounds present. Extremities: No lower extremity  edema. No deformity. Radial and distal pedal pulses 2+ and equal bilaterally. Skin: Warm and dry. Neuro:  No focal deficits. Psych: Normal affect. Responds appropriately.  Labs    Chemistry Recent Labs  Lab 01/21/19 1650 01/23/19 0508  NA 139 140  K 4.0 3.9  CL 104 108  CO2 23 22  GLUCOSE 79 101*  BUN 16 11  CREATININE 1.15 1.16  CALCIUM 9.6 9.5  GFRNONAA >60 >60  GFRAA >60 >60  ANIONGAP 12 10     Hematology Recent Labs  Lab 01/21/19 1650 01/23/19 0508  WBC 9.2 7.9  RBC 5.68 5.74  HGB 15.3 15.6  HCT 47.9 47.3  MCV 84.3 82.4  MCH 26.9 27.2  MCHC 31.9 33.0  RDW 13.1 13.0  PLT 166 161    Cardiac Enzymes Recent Labs  Lab 01/21/19 1650 01/21/19 2200 01/22/19 0314 01/22/19 0933  TROPONINI <0.03 <0.03 <0.03 <0.03   No results for input(s): TROPIPOC in the last 168 hours.   BNPNo results for input(s): BNP, PROBNP in the last 168 hours.   DDimer No results for input(s): DDIMER in the last 168 hours.   Radiology    Dg Chest 2 View  Result Date: 01/21/2019 CLINICAL DATA:  Chest pain intermittently for 4 weeks. EXAM: CHEST -  2 VIEW COMPARISON:  None. FINDINGS: The heart size and mediastinal contours are within normal limits. Both lungs are clear. The visualized skeletal structures are unremarkable. IMPRESSION: No active cardiopulmonary disease. Electronically Signed   By: Gerome Samavid  Williams III M.D   On: 01/21/2019 17:26    Cardiac Studies   Echocardiogram 01/22/2019: Impressions: 1. The left ventricle has normal systolic function with an ejection fraction of 60-65%. The cavity size was normal. Left ventricular diastolic function could not be evaluated due to indeterminate diastolic function. No evidence of left ventricular  regional wall motion abnormalities.  2. The right ventricle has normal systolic function. The cavity was normal. There is no increase in right ventricular wall thickness. Right ventricular systolic pressure could not be assessed.  3. The aortic  valve is tricuspid. Mild sclerosis of the aortic valve. Mild to moderate aortic annular calcification noted. _______________  Left Heart Catheterization 01/22/2019:  Unstable angina related to eccentric ruptured plaque in proximal to mid LAD forming a [place] 1, 1, 0 bifurcation stenosis with the first diagonal.  Successful PCI with overlap stenting of the proximal to mid LAD using 3.0 x 15 and 3.0 x 12 Onyx DES postdilated to 3.75 mm in diameter.  TIMI grade III flow.  Sidebranch did not demonstrate evidence of reduced flow.  (Geographic miss with the first stent due to the patient taking a deep breath as stent was being inflated causing the stent to retract proximally and missed the lesion.)  Left main is normal  Circumflex is large and normal  Right coronary contains luminal irregularities in the proximal mid and distal vessel up to 25%.  Overall normal LV function with EF 55% and normal EDP.  Mild mid anterior wall hypokinesis.  Recommendations:  Aspirin and Brilinta for 12 months  Aggressive secondary risk factor modification.  Rosuvastatin dose was increased to 40 mg.  Eligible for discharge in a.m.  Diagnostic  Dominance: Right    Intervention      Patient Profile   [name] is a 63 y.o. male with a history of hypertension, hyperlipidemia, and tobacco abuse but no known cardiac history who presented on 01/21/2019 with chest pain concerning for unstable angina.   Assessment & Plan    Unstable Angina - EKG showed no acute ST/T changes. - Troponin negative x4. - Echo showed LVEF of 60-65% with no regional wall motion abnormalities.  - Left heart catheterization showed eccentric ruptured plaque in proximal to mid LAD forming a [place] 1, 1, 0 bifurcation stenosis with the first diagonal. Patient underwent successful PCI with 2 overlapping DES of the proximal to mid LAD. See full report above. - Continue dual antiplatelet therapy with Aspirin and Brilinta for 12 months.  - Continue aggressive secondary prevention with high-intensity statin. No beta-blocker due to bradycardia. - Likely can be discharged today. MD to see.   Hypertension - BP currently well controlled.  - Continue home Lisinopril 20mg  daily.  Hyperlipidemia - Lipid panel this admission: Cholesterol 183, Triglycerides 144, HDL 30, LDL 124.  - Patient on Crestor 20mg  daily at home. This was increased to 40mg  daily this admission. Continue at discharge.  Tobacco Use - Complete cessation is advised.   For questions or updates, please contact [facility] Please consult [email or web address] for contact info under Cardiology/STEMI.      Signed, Corrin Parkerallie E Goodrich, PA-C  01/23/2019, 8:50 AM   Pager: [phone]  Ready for d/c no angina Post stenting of mid LAD right radial A no hematoma. Can go back  to work Monday patient can f/u with myself or Dr Katrinka BlazingSmith Dr Wyline MoodBranch admitted him  [place]   Charlton Hawseter

## 2019-01-23 NOTE — Care Management (Signed)
Spoke w [facility] pharmacy, they will be delivering meds to bedside. Patient provided with Brilinta coupon co pay reduction card for outpatient use, TOC processed 30 day card. No other CM needs

## 2019-01-23 NOTE — Discharge Summary (Signed)
Physician Discharge Summary  Steve ParadiseRobert C Hardeman GNF:[id number] DOB: 02/27/1956 DOA: 01/21/2019  PCP: Blane Oharaox, Kirsten, MD  Admit date: 01/21/2019 Discharge date: 01/23/2019  Admitted From: Home Disposition: Home  Recommendations for Outpatient Follow-up:  1. Follow up with PCP in 1 week 2. Follow up with cardiology 3. Dual antiplatelet therapy for 12 months following heart cath with stent placement 4. Please obtain BMP/CBC in one week 5. Please follow up on the following pending results: None  Home Health: Outpatient cardiac rehabilitation Equipment/Devices: None  Discharge Condition: Stable CODE STATUS: Full code Diet recommendation: Heart healthy   Brief/Interim Summary:  Admission HPI written by Rometta EmeryMohammad L Garba, MD   Chief Complaint: Chest pain  HPI: Steve ParadiseRobert C Orozco is a 63 y.o. male with medical history significant of hypertension, hyperlipidemia, morbid obesity who presented to the ER with intermittent chest pain in the last 4 weeks but got worse today.  Patient does yard work mostly.  And he realized the pain is worse with a yard work.  Rated as 7 out of 10.  It radiates to his arms.  No diaphoresis.  Symptoms are relieved by rest.  Patient has taken aspirin and nitroglycerin that improve his symptoms.  He went to see his PCP.  Patient decided to come to the hospital where he is being evaluated.  Patient has no leg swelling.  No PND no orthopnea.  Denied any abdominal pain.  No hematemesis.  He smokes about half to 1 pack/day..  ED Course: His temperature is 98 blood pressure is 120/90 pulse 40-66 respiratory rate of 22 oxygen sat 90% on room air..  Patient's CBC and chemistry is within normal.  Troponin is less than 0.03. COVID-19 is negative.  Chest x-ray showed no active findings.  EKG showed no acute findings.  Patient is being admitted for rule out MI.   Hospital course:  Unstable angina CAD Patient with mildly elevated, but flat troponin. EKG without acute t-wave  changes. Cardiology consulted and performed left heart cath with stent placement (full report below). Started on Brilinta and aspirin on discharge.   Essential hypertension Continue lisinopril.  Hyperlipidemia Continue fenofibrate. Crestor increased to 40 mg daily.  GERD Continued PPI.  Tobacco use Smoking cessation discussed during admission.  Discharge Diagnoses:  Principal Problem:   Unstable angina (HCC) Active Problems:   Benign essential HTN   Hyperlipidemia   Tobacco use disorder    Discharge Instructions  Discharge Instructions    Amb Referral to Cardiac Rehabilitation   Complete by:  As directed    Will send Cardiac Rehab Phase 2 order to [place]   Diagnosis:  Coronary Stents   After initial evaluation and assessments completed: Virtual Based Care may be provided alone or in conjunction with Phase 2 Cardiac Rehab based on patient barriers.:  Yes   Call MD for:  difficulty breathing, headache or visual disturbances   Complete by:  As directed    Call MD for:  severe uncontrolled pain   Complete by:  As directed    Diet - low sodium heart healthy   Complete by:  As directed    Increase activity slowly   Complete by:  As directed      Allergies as of 01/23/2019   No Known Allergies     Medication List    TAKE these medications   aspirin 81 MG chewable tablet Chew 1 tablet (81 mg total) by mouth daily.   fenofibrate 145 MG tablet Commonly known as:  [employer] IncRICOR  Take 145 mg by mouth daily.   lisinopril 20 MG tablet Commonly known as:  ZESTRIL Take 1 tablet by mouth daily.   RABEprazole 20 MG tablet Commonly known as:  ACIPHEX Take 1 tablet by mouth daily.   rosuvastatin 40 MG tablet Commonly known as:  CRESTOR Take 1 tablet (40 mg total) by mouth daily. What changed:    medication strength  how much to take   tadalafil 5 MG tablet Commonly known as:  CIALIS Take by mouth daily.   ticagrelor 90 MG Tabs tablet Commonly known as:  BRILINTA  Take 1 tablet (90 mg total) by mouth 2 (two) times daily.      Follow-up Information    Cox, Kirsten, MD. Schedule an appointment as soon as possible for a visit in 1 week(s).   Specialties:  Family Medicine, Interventional Cardiology, Radiology, Anesthesiology Contact information: [address] [place] [address] [phone]        Wendall Stade, MD .   Specialty:  Cardiology Contact information: [phone] [address] [place] [address] [phone]          No Known Allergies  Consultations:  Cardiology   Procedures/Studies: Dg Chest 2 View  Result Date: 01/21/2019 CLINICAL DATA:  Chest pain intermittently for 4 weeks. EXAM: CHEST - 2 VIEW COMPARISON:  None. FINDINGS: The heart size and mediastinal contours are within normal limits. Both lungs are clear. The visualized skeletal structures are unremarkable. IMPRESSION: No active cardiopulmonary disease. Electronically Signed   By: Gerome Sam III M.D   On: 01/21/2019 17:26     Left heart catheterization (01/22/2019)  Coronary angiography, left heart catheterization, LV gram, and LAD stent.  Right radial access via ultrasound guidance.  Normal left main, eccentric 85% proximal to mid LAD forming a Medina 110 bifurcation stenosis with a moderate size diagonal.  Circumflex is large and free of disease, and the right coronary contains irregularities but no high-grade obstruction.  Mild mid anterior hypokinesis.  EF 55%.  LV end-diastolic pressure less than 15.  Successful LAD stent proximal to mid LAD using overlapping 3.0 Onyx postdilated to 3.75 mm with TIMI grade III flow.  Single stent technique crossing over diagonal without loss of flow.  2 stents were required because there was geographic miss with the first stent as the patient take a deep breath instantaneous with initiation of balloon inflation causing the stent to miss the lesion proximally.  The second stent overlapped the  distal margin of the first stent and treated the culprit site.  Discharge in a.m. if no issues.  Aspirin and Brilinta x 12 months.  Have intensified statin dose.   Subjective: Some mild shortness of breath overnight. No chest pain.  Discharge Exam: Vitals:   01/22/19 2005 01/23/19 0615  BP: 99/73 118/88  Pulse: (!) 55 (!) 57  Resp: 12 18  Temp: 97.7 F (36.5 C)   SpO2: 97% 99%   Vitals:   01/22/19 1823 01/22/19 1854 01/22/19 2005 01/23/19 0615  BP: 112/79 106/80 99/73 118/88  Pulse: 62 (!) 56 (!) 55 (!) 57  Resp: Temp:   97.7 F (36.5 C)   TempSrc:   Oral   SpO2: 98% 98% 97% 99%  Weight:    101.8 kg  Height:        General: Pt is alert, awake, not in acute distress Cardiovascular: RRR, S1/S2 +, no rubs, no gallops Respiratory: CTA bilaterally, no wheezing, no rhonchi  Abdominal: Soft, NT, ND, bowel sounds + Extremities: no edema, no cyanosis    The results of significant diagnostics from this hospitalization (including imaging, microbiology, ancillary and laboratory) are listed below for reference.     Microbiology: Recent Results (from the past 240 hour(s))  SARS Coronavirus 2 (CEPHEID - Performed in [facility] hospital lab), Hosp Order     Status: None   Collection Time: 01/21/19  8:55 PM  Result Value Ref Range Status   SARS Coronavirus 2 NEGATIVE NEGATIVE Final    Comment: (NOTE) If result is NEGATIVE SARS-CoV-2 target nucleic acids are NOT DETECTED. The SARS-CoV-2 RNA is generally detectable in upper and lower  respiratory specimens during the acute phase of infection. The lowest  concentration of SARS-CoV-2 viral copies this assay can detect is 250  copies / mL. A negative result does not preclude SARS-CoV-2 infection  and should not be used as the sole basis for treatment or other  patient management decisions.  A negative result may occur with  improper specimen collection / handling, submission of specimen other  than nasopharyngeal  swab, presence of viral mutation(s) within the  areas targeted by this assay, and inadequate number of viral copies  (<250 copies / mL). A negative result must be combined with clinical  observations, patient history, and epidemiological information. If result is POSITIVE SARS-CoV-2 target nucleic acids are DETECTED. The SARS-CoV-2 RNA is generally detectable in upper and lower  respiratory specimens dur ing the acute phase of infection.  Positive  results are indicative of active infection with SARS-CoV-2.  Clinical  correlation with patient history and other diagnostic information is  necessary to determine patient infection status.  Positive results do  not rule out bacterial infection or co-infection with other viruses. If result is PRESUMPTIVE POSTIVE SARS-CoV-2 nucleic acids MAY BE PRESENT.   A presumptive positive result was obtained on the submitted specimen  and confirmed on repeat testing.  While 2019 novel coronavirus  (SARS-CoV-2) nucleic acids may be present in the submitted sample  additional confirmatory testing may be necessary for epidemiological  and / or clinical management purposes  to differentiate between  SARS-CoV-2 and other Sarbecovirus currently known to infect humans.  If clinically indicated additional testing with an alternate test  methodology [phone]) is advised. The SARS-CoV-2 RNA is generally  detectable in upper and lower respiratory sp ecimens during the acute  phase of infection. The expected result is Negative. Fact Sheet for Patients:  StrictlyIdeas.no Fact Sheet for Healthcare Providers: BankingDealers.co.za This test is not yet approved or cleared by the [place] FDA and has been authorized for detection and/or diagnosis of SARS-CoV-2 by FDA under an Emergency Use Authorization (EUA).  This EUA will remain in effect (meaning this test can be used) for the duration of the COVID-19 declaration  under Section 564(b)(1) of the Act, 21 U.S.C. section 360bbb-3(b)(1), unless the authorization is terminated or revoked sooner. Performed at [facility], [place] [address] [place], [place] [address]      Labs: BNP (last 3 results) No results for input(s): BNP in the last 8760 hours. Basic Metabolic Panel: Recent Labs  Lab 01/21/19 1650 01/23/19 0508  NA 139 140  K 4.0 3.9  CL 104 108  CO2 23 22  GLUCOSE 79 101*  BUN 16 11  CREATININE 1.15 1.16  CALCIUM 9.6 9.5   Liver Function Tests: No results for input(s): AST, ALT, ALKPHOS, BILITOT, PROT, ALBUMIN in the last 168 hours. No results for input(s):  LIPASE, AMYLASE in the last 168 hours. No results for input(s): AMMONIA in the last 168 hours. CBC: Recent Labs  Lab 01/21/19 1650 01/23/19 0508  WBC 9.2 7.9  NEUTROABS 4.9  --   HGB 15.3 15.6  HCT 47.9 47.3  MCV 84.3 82.4  PLT 166 161   Cardiac Enzymes: Recent Labs  Lab 01/21/19 1650 01/21/19 2200 01/22/19 0314 01/22/19 0933  TROPONINI <0.03 <0.03 <0.03 <0.03   BNP: Invalid input(s): POCBNP CBG: No results for input(s): GLUCAP in the last 168 hours. D-Dimer No results for input(s): DDIMER in the last 72 hours. Hgb A1c No results for input(s): HGBA1C in the last 72 hours. Lipid Profile Recent Labs    01/22/19 0933  CHOL 183  HDL 30*  LDLCALC 124*  TRIG 144  CHOLHDL 6.1   Thyroid function studies No results for input(s): TSH, T4TOTAL, T3FREE, THYROIDAB in the last 72 hours.  Invalid input(s): FREET3 Anemia work up No results for input(s): VITAMINB12, FOLATE, FERRITIN, TIBC, IRON, RETICCTPCT in the last 72 hours. Urinalysis No results found for: COLORURINE, APPEARANCEUR, LABSPEC, PHURINE, GLUCOSEU, HGBUR, BILIRUBINUR, KETONESUR, PROTEINUR, UROBILINOGEN, NITRITE, LEUKOCYTESUR Sepsis Labs Invalid input(s): PROCALCITONIN,  WBC,  LACTICIDVEN Microbiology Recent Results (from the past 240 hour(s))  SARS Coronavirus 2 (CEPHEID - Performed in [facility]), Hosp Order     Status: None   Collection Time: 01/21/19  8:55 PM  Result Value Ref Range Status   SARS Coronavirus 2 NEGATIVE NEGATIVE Final    Comment: (NOTE) If result is NEGATIVE SARS-CoV-2 target nucleic acids are NOT DETECTED. The SARS-CoV-2 RNA is generally detectable in upper and lower  respiratory specimens during the acute phase of infection. The lowest  concentration of SARS-CoV-2 viral copies this assay can detect is 250  copies / mL. A negative result does not preclude SARS-CoV-2 infection  and should not be used as the sole basis for treatment or other  patient management decisions.  A negative result may occur with  improper specimen collection / handling, submission of specimen other  than nasopharyngeal swab, presence of viral mutation(s) within the  areas targeted by this assay, and inadequate number of viral copies  (<250 copies / mL). A negative result must be combined with clinical  observations, patient history, and epidemiological information. If result is POSITIVE SARS-CoV-2 target nucleic acids are DETECTED. The SARS-CoV-2 RNA is generally detectable in upper and lower  respiratory specimens dur ing the acute phase of infection.  Positive  results are indicative of active infection with SARS-CoV-2.  Clinical  correlation with patient history and other diagnostic information is  necessary to determine patient infection status.  Positive results do  not rule out bacterial infection or co-infection with other viruses. If result is PRESUMPTIVE POSTIVE SARS-CoV-2 nucleic acids MAY BE PRESENT.   A presumptive positive result was obtained on the submitted specimen  and confirmed on repeat testing.  While 2019 novel coronavirus  (SARS-CoV-2) nucleic acids may be present in the submitted sample  additional confirmatory testing may be necessary for epidemiological  and / or clinical management purposes  to differentiate between  SARS-CoV-2 and  other Sarbecovirus currently known to infect humans.  If clinically indicated additional testing with an alternate test  methodology [phone]([id number]) is advised. The SARS-CoV-2 RNA is generally  detectable in upper and lower respiratory sp ecimens during the acute  phase of infection. The expected result is Negative. Fact Sheet for Patients:  BoilerBrush.com.cyhttps://www.fda.gov/media/136312/download Fact Sheet for Healthcare Providers: https://pope.com/https://www.fda.gov/media/136313/download This test is  not yet approved or cleared by the [place] FDA and has been authorized for detection and/or diagnosis of SARS-CoV-2 by FDA under an Emergency Use Authorization (EUA).  This EUA will remain in effect (meaning this test can be used) for the duration of the COVID-19 declaration under Section 564(b)(1) of the Act, 21 U.S.C. section 360bbb-3(b)(1), unless the authorization is terminated or revoked sooner. Performed at [facility], [address] [place], [place] [address]     SIGNED:   Jacquelin Hawkingalph Willetta York, MD [facility] 01/23/2019, 9:24 AM

## 2019-01-28 DIAGNOSIS — F17210 Nicotine dependence, cigarettes, uncomplicated: Secondary | ICD-10-CM | POA: Diagnosis not present

## 2019-01-28 DIAGNOSIS — I25111 Atherosclerotic heart disease of native coronary artery with angina pectoris with documented spasm: Secondary | ICD-10-CM | POA: Diagnosis not present

## 2019-02-06 ENCOUNTER — Ambulatory Visit: Payer: BC Managed Care – PPO | Admitting: Cardiology

## 2019-04-15 ENCOUNTER — Ambulatory Visit (INDEPENDENT_AMBULATORY_CARE_PROVIDER_SITE_OTHER): Payer: BC Managed Care – PPO | Admitting: Cardiology

## 2019-04-15 ENCOUNTER — Encounter: Payer: Self-pay | Admitting: Cardiology

## 2019-04-15 ENCOUNTER — Other Ambulatory Visit: Payer: Self-pay

## 2019-04-15 VITALS — BP 118/82 | HR 78 | Ht 70.0 in | Wt 234.6 lb

## 2019-04-15 DIAGNOSIS — I2510 Atherosclerotic heart disease of native coronary artery without angina pectoris: Secondary | ICD-10-CM | POA: Diagnosis not present

## 2019-04-15 DIAGNOSIS — F17200 Nicotine dependence, unspecified, uncomplicated: Secondary | ICD-10-CM

## 2019-04-15 DIAGNOSIS — E782 Mixed hyperlipidemia: Secondary | ICD-10-CM | POA: Diagnosis not present

## 2019-04-15 DIAGNOSIS — I10 Essential (primary) hypertension: Secondary | ICD-10-CM | POA: Diagnosis not present

## 2019-04-15 LAB — BASIC METABOLIC PANEL
BUN/Creatinine Ratio: 13 (ref 10–24)
BUN: 19 mg/dL (ref 8–27)
CO2: 19 mmol/L — ABNORMAL LOW (ref 20–29)
Calcium: 10 mg/dL (ref 8.6–10.2)
Chloride: 106 mmol/L (ref 96–106)
Creatinine, Ser: 1.44 mg/dL — ABNORMAL HIGH (ref 0.76–1.27)
GFR calc Af Amer: 60 mL/min/{1.73_m2} (ref >59)
GFR calc non Af Amer: 52 mL/min/{1.73_m2} — ABNORMAL LOW (ref >59)
Glucose: 121 mg/dL — ABNORMAL HIGH (ref 65–99)
Potassium: 4.8 mmol/L (ref 3.5–5.2)
Sodium: 138 mmol/L (ref 134–144)

## 2019-04-15 NOTE — Addendum Note (Signed)
Addended by: Beckey Rutter on: 04/15/2019 08:38 AM   Modules accepted: Orders

## 2019-04-15 NOTE — Patient Instructions (Addendum)
Medication Instructions:  Your physician has recommended you make the following change in your medication:   DECREASE lisinopril to 10 mg (0.5 tablets) once daily  If you need a refill on your cardiac medications before your next appointment, please call your pharmacy.   Lab work: Your physician recommends that you have a BMP drawn today.   If you have labs (blood work) drawn today and your tests are completely normal, you will receive your results only by: Marland Kitchen MyChart Message (if you have MyChart) OR . A paper copy in the mail If you have any lab test that is abnormal or we need to change your treatment, we will call you to review the results.  Testing/Procedures: NONE  Follow-Up: At [facility], you and your health needs are our priority.  As part of our continuing mission to provide you with exceptional heart care, we have created designated Provider Care Teams.  These Care Teams include your primary Cardiologist (physician) and Advanced Practice Providers (APPs -  Physician Assistants and Nurse Practitioners) who all work together to provide you with the care you need, when you need it. You will need a follow up appointment in 3 months.

## 2019-04-15 NOTE — Progress Notes (Signed)
Cardiology Office Note:    Date:  04/15/2019   ID:  Steve Orozco, DOB 21-Dec-1955, MRN [id number]  PCP:  Blane Ohara, MD  Cardiologist:  Garwin Brothers, MD   Referring MD: Blane Ohara, MD    ASSESSMENT:    1. Coronary artery disease involving native coronary artery of native heart without angina pectoris   2. Benign essential HTN   3. Tobacco use disorder   4. Mixed hyperlipidemia    PLAN:    In order of problems listed above:  1. Coronary artery disease: Secondary prevention stressed to the patient.  Importance of compliance with diet and medication stressed and he vocalized understanding.  Weight reduction was stressed.  Importance of regular exercise stressed. 2. Essential hypertension: Blood pressure is borderline so we will cut down his lisinopril to 10 mg daily.  He will keep a log of all of his blood pressures and let us know about them in a week. 3. Mixed dyslipidemia and obesity: Diet was discussed.  He will have blood work in the next few days which will be Chem-7 liver lipid check 4. Cigarette smoker: Smokes half a pack a day daily.  I spent 5 minutes with the patient discussing solely about smoking. Smoking cessation was counseled. I suggested to the patient also different medications and pharmacological interventions. Patient is keen to try stopping on its own at this time. He will get back to me if he needs any further assistance in this matter. 5. Patient will be seen in follow-up appointment in 3 months or earlier if the patient has any concerns    Medication Adjustments/Labs and Tests Ordered: Current medicines are reviewed at length with the patient today.  Concerns regarding medicines are outlined above.  No orders of the defined types were placed in this encounter.  No orders of the defined types were placed in this encounter.    No chief complaint on file.    History of Present Illness:    Steve Orozco is a 63 y.o. male.  Patient has past  medical history of coronary artery disease post LAD stenting.  Complete report is mentioned below.  Patient mentions to me that he is feeling fine.  No chest pain orthopnea or PND.  His only issue is that he unfortunately continues to smoke.  He has history of hypertension and dyslipidemia.  He is feeling weak and whenever he changes posture he feels lightheaded.  At the time of my evaluation, the patient is alert awake oriented and in no distress.  He leads a sedentary lifestyle.  Past Medical History:  Diagnosis Date  . Hyperlipidemia   . Hypertension     Past Surgical History:  Procedure Laterality Date  . CORONARY STENT INTERVENTION N/A 01/22/2019   Procedure: CORONARY STENT INTERVENTION;  Surgeon: Lyn Records, MD;  Location: [facility] INVASIVE CV LAB;  Service: Cardiovascular;  Laterality: N/A;  . HERNIA REPAIR    . LEFT HEART CATH AND CORONARY ANGIOGRAPHY N/A 01/22/2019   Procedure: LEFT HEART CATH AND CORONARY ANGIOGRAPHY;  Surgeon: Lyn Records, MD;  Location: [facility] INVASIVE CV LAB;  Service: Cardiovascular;  Laterality: N/A;    Current Medications: Current Meds  Medication Sig  . aspirin 81 MG chewable tablet Chew 1 tablet (81 mg total) by mouth daily.  . fenofibrate (TRICOR) 145 MG tablet Take 145 mg by mouth daily.  Marland Kitchen lisinopril (ZESTRIL) 20 MG tablet Take 1 tablet by mouth daily.  . RABEprazole (ACIPHEX) 20 MG tablet Take  1 tablet by mouth daily.  . rosuvastatin (CRESTOR) 40 MG tablet Take 1 tablet (40 mg total) by mouth daily.  . tadalafil (CIALIS) 5 MG tablet Take by mouth daily.  . ticagrelor (BRILINTA) 90 MG TABS tablet Take 1 tablet (90 mg total) by mouth 2 (two) times daily.     Allergies:   Patient has no known allergies.   Social History   Socioeconomic History  . Marital status: Unknown    Spouse name: Byrd HesselbachMaria  . Number of children: 5  . Years of education: 16  . Highest education level: Bachelor's degree (e.g., BA, AB, BS)  Occupational History  . Occupation: IT trainerCPA   Social Needs  . Financial resource strain: Not on file  . Food insecurity    Worry: Never true    Inability: Never true  . Transportation needs    Medical: No    Non-medical: No  Tobacco Use  . Smoking status: Current Every Day Smoker    Packs/day: 0.50    Years: 35.00    Pack years: 17.50  . Smokeless tobacco: Never Used  Substance and Sexual Activity  . Alcohol use: Yes    Alcohol/week: 1.0 standard drinks    Types: 1 Cans of beer per week    Comment: may have 5 drinks a year  . Drug use: Never  . Sexual activity: Not on file  Lifestyle  . Physical activity    Days per week: 0 days    Minutes per session: 0 min  . Stress: Not at all  Relationships  . Social Musicianconnections    Talks on phone: More than three times a week    Gets together: Never    Attends religious service: Never    Active member of club or organization: Yes    Attends meetings of clubs or organizations: More than 4 times per year    Relationship status: Married  Other Topics Concern  . Not on file  Social History Narrative  . Not on file     Family History: The patient's family history includes AAA (abdominal aortic aneurysm) in his mother; CAD in his father.  ROS:   Please see the history of present illness.    All other systems reviewed and are negative.  EKGs/Labs/Other Studies Reviewed:    The following studies were reviewed today: CORONARY STENT INTERVENTION  LEFT HEART CATH AND CORONARY ANGIOGRAPHY  Conclusion   Unstable angina related to eccentric ruptured plaque in proximal to mid LAD forming a Medina 1, 1, 0 bifurcation stenosis with the first diagonal.  Successful PCI with overlap stenting of the proximal to mid LAD using 3.0 x 15 and 3.0 x 12 Onyx DES postdilated to 3.75 mm in diameter.  TIMI grade III flow.  Sidebranch did not demonstrate evidence of reduced flow.  (Geographic miss with the first stent due to the patient taking a deep breath as stent was being inflated causing the  stent to retract proximally and missed the lesion.)  Left main is normal  Circumflex is large and normal  Right coronary contains luminal irregularities in the proximal mid and distal vessel up to 25%.  Overall normal LV function with EF 55% and normal EDP.  Mild mid anterior wall hypokinesis.  RECOMMENDATIONS:   Aspirin and Brilinta for 12 months  Aggressive secondary risk factor modification.  Rosuvastatin dose was increased to 40 mg.  Eligible for discharge in a.m.      Recent Labs: 01/23/2019: BUN 11; Creatinine, Ser 1.16; Hemoglobin  15.6; Platelets 161; Potassium 3.9; Sodium 140  Recent Lipid Panel    Component Value Date/Time   CHOL 183 01/22/2019 0933   TRIG 144 01/22/2019 0933   HDL 30 (L) 01/22/2019 0933   CHOLHDL 6.1 01/22/2019 0933   VLDL 29 01/22/2019 0933   LDLCALC 124 (H) 01/22/2019 0933    Physical Exam:    VS:  There were no vitals taken for this visit.    Wt Readings from Last 3 Encounters:  01/23/19 224 lb 6.4 oz (101.8 kg)     GEN: Patient is in no acute distress HEENT: Normal NECK: No JVD; No carotid bruits LYMPHATICS: No lymphadenopathy CARDIAC: Hear sounds regular, 2/6 systolic murmur at the apex. RESPIRATORY:  Clear to auscultation without rales, wheezing or rhonchi  ABDOMEN: Soft, non-tender, non-distended MUSCULOSKELETAL:  No edema; No deformity  SKIN: Warm and dry NEUROLOGIC:  Alert and oriented x 3 PSYCHIATRIC:  Normal affect   Signed, Jenean Lindau, MD  04/15/2019 8:26 AM    [place]

## 2019-04-25 ENCOUNTER — Telehealth: Payer: Self-pay

## 2019-04-25 DIAGNOSIS — E782 Mixed hyperlipidemia: Secondary | ICD-10-CM

## 2019-04-25 NOTE — Telephone Encounter (Signed)
Results relayed to patient, he states he has a hx of kidney stones. Patient instructed to stay hydrated and come back in 2 weeks for repeat BMP. He is in agreement and no further questions. Copy sent to Dr. Tobie Poet per Dr. Docia Furl request.

## 2019-04-25 NOTE — Telephone Encounter (Signed)
Left message for patient to call office for results. °

## 2019-04-25 NOTE — Addendum Note (Signed)
Addended by: Beckey Rutter on: 04/25/2019 10:32 AM   Modules accepted: Orders

## 2019-04-25 NOTE — Telephone Encounter (Signed)
-----   Message from Jenean Lindau, MD sent at 04/16/2019  8:07 AM EDT ----- The results of the study is unremarkable.  He needs to keep himself well-hydrated and recheck Chem-7 in 2 weeks.  Please inform patient. I will discuss in detail at next appointment. Cc  primary care/referring physician Jenean Lindau, MD 04/16/2019 8:06 AM

## 2019-07-15 ENCOUNTER — Ambulatory Visit: Payer: BC Managed Care – PPO | Admitting: Cardiology

## 2019-07-18 ENCOUNTER — Ambulatory Visit (INDEPENDENT_AMBULATORY_CARE_PROVIDER_SITE_OTHER): Payer: BC Managed Care – PPO | Admitting: Cardiology

## 2019-07-18 ENCOUNTER — Other Ambulatory Visit: Payer: Self-pay

## 2019-07-18 ENCOUNTER — Encounter: Payer: Self-pay | Admitting: Cardiology

## 2019-07-18 VITALS — BP 120/90 | HR 73 | Ht 70.0 in | Wt 240.0 lb

## 2019-07-18 DIAGNOSIS — I10 Essential (primary) hypertension: Secondary | ICD-10-CM | POA: Diagnosis not present

## 2019-07-18 DIAGNOSIS — I2510 Atherosclerotic heart disease of native coronary artery without angina pectoris: Secondary | ICD-10-CM

## 2019-07-18 DIAGNOSIS — E782 Mixed hyperlipidemia: Secondary | ICD-10-CM | POA: Diagnosis not present

## 2019-07-18 DIAGNOSIS — Z1329 Encounter for screening for other suspected endocrine disorder: Secondary | ICD-10-CM | POA: Diagnosis not present

## 2019-07-18 MED ORDER — TADALAFIL 20 MG PO TABS: 20.0000 mg | ORAL_TABLET | Freq: Every day | ORAL | 2 refills | Status: AC

## 2019-07-18 MED ORDER — TICAGRELOR 90 MG PO TABS: 90.0000 mg | ORAL_TABLET | Freq: Two times a day (BID) | ORAL | 1 refills | Status: DC

## 2019-07-18 NOTE — Progress Notes (Signed)
Cardiology Office Note:    Date:  07/18/2019   ID:  Steve Orozco, DOB 1956/04/01, MRN [id number]  PCP:  Rochel Brome, MD  Cardiologist:  Jenean Lindau, MD   Referring MD: Rochel Brome, MD    ASSESSMENT:    1. Coronary artery disease involving native coronary artery of native heart without angina pectoris   2. Benign essential HTN   3. Mixed hyperlipidemia    PLAN:    In order of problems listed above:  1. Coronary artery disease: Secondary prevention stressed with the patient.  Importance of compliance with diet and medication stressed and he vocalized understanding.  Importance of regular exercise stressed at least half an hour of walking every day at least 6 days a week and promises to do so. 2. Essential hypertension: Blood pressure stable lifestyle modification and salt intake issues were discussed.  Weight reduction was stressed. 3. Mixed dyslipidemia: Diet was discussed and he will have all blood work today including fasting lipids. 4. Patient will be seen in follow-up appointment in 6 months or earlier if the patient has any concerns    Medication Adjustments/Labs and Tests Ordered: Current medicines are reviewed at length with the patient today.  Concerns regarding medicines are outlined above.  No orders of the defined types were placed in this encounter.  No orders of the defined types were placed in this encounter.    Chief Complaint  Patient presents with   Follow-up     History of Present Illness:    Steve Orozco is a 63 y.o. male.  Patient has past medical history of coronary artery disease, essential hypertension and dyslipidemia.  He denies any problems at this time and takes care of activities of daily living.  No chest pain orthopnea or PND.  He leads a sedentary lifestyle and unfortunately does not exercise on a regular basis.  He seems to be fairly lax with his diet also.  At the time of my evaluation, the patient is alert awake oriented  and in no distress.  Past Medical History:  Diagnosis Date   Hyperlipidemia    Hypertension     Past Surgical History:  Procedure Laterality Date   CORONARY STENT INTERVENTION N/A 01/22/2019   Procedure: CORONARY STENT INTERVENTION;  Surgeon: Belva Crome, MD;  Location: [facility];  Service: Cardiovascular;  Laterality: N/A;   HERNIA REPAIR     LEFT HEART CATH AND CORONARY ANGIOGRAPHY N/A 01/22/2019   Procedure: LEFT HEART CATH AND CORONARY ANGIOGRAPHY;  Surgeon: Belva Crome, MD;  Location: [facility];  Service: Cardiovascular;  Laterality: N/A;    Current Medications: Current Meds  Medication Sig   aspirin 81 MG chewable tablet Chew 1 tablet (81 mg total) by mouth daily.   fenofibrate (TRICOR) 145 MG tablet Take 145 mg by mouth daily.   lisinopril (ZESTRIL) 20 MG tablet Take 0.5 tablets by mouth daily.   nitroGLYCERIN (NITROSTAT) 0.4 MG SL tablet    RABEprazole (ACIPHEX) 20 MG tablet Take 1 tablet by mouth daily.   rosuvastatin (CRESTOR) 40 MG tablet Take 1 tablet (40 mg total) by mouth daily.   tadalafil (CIALIS) 5 MG tablet Take by mouth daily.   ticagrelor (BRILINTA) 90 MG TABS tablet Take 1 tablet (90 mg total) by mouth 2 (two) times daily.     Allergies:   Patient has no known allergies.   Social History   Socioeconomic History   Marital status: Unknown    Spouse  name: Byrd HesselbachMaria   Number of children: 5   Years of education: 16   Highest education level: Bachelor's degree (e.g., BA, AB, BS)  Occupational History   Occupation: Museum/gallery exhibitions officerCPA  Social Needs   Financial resource strain: Not on file   Food insecurity    Worry: Never true    Inability: Never true   Transportation needs    Medical: No    Non-medical: No  Tobacco Use   Smoking status: Current Every Day Smoker    Packs/day: 0.50    Years: 35.00    Pack years: 17.50   Smokeless tobacco: Never Used  Substance and Sexual Activity   Alcohol use: Yes    Alcohol/week: 1.0  standard drinks    Types: 1 Cans of beer per week    Comment: may have 5 drinks a year   Drug use: Never   Sexual activity: Not on file  Lifestyle   Physical activity    Days per week: 0 days    Minutes per session: 0 min   Stress: Not at all  Relationships   Social connections    Talks on phone: More than three times a week    Gets together: Never    Attends religious service: Never    Active member of club or organization: Yes    Attends meetings of clubs or organizations: More than 4 times per year    Relationship status: Married  Other Topics Concern   Not on file  Social History Narrative   Not on file     Family History: The patient's family history includes AAA (abdominal aortic aneurysm) in his mother; CAD in his father.  ROS:   Please see the history of present illness.    All other systems reviewed and are negative.  EKGs/Labs/Other Studies Reviewed:    The following studies were reviewed today: Lyn RecordsSmith, Henry W, MD Study date: 01/22/19  Physicians  Panel Physicians Referring Physician Case Authorizing Physician  Lyn RecordsSmith, Henry W, MD (Primary)    Procedures  CORONARY STENT INTERVENTION  LEFT HEART CATH AND CORONARY ANGIOGRAPHY  Conclusion   Unstable angina related to eccentric ruptured plaque in proximal to mid LAD forming a [place] 1, 1, 0 bifurcation stenosis with the first diagonal.  Successful PCI with overlap stenting of the proximal to mid LAD using 3.0 x 15 and 3.0 x 12 Onyx DES postdilated to 3.75 mm in diameter.  TIMI grade III flow.  Sidebranch did not demonstrate evidence of reduced flow.  (Geographic miss with the first stent due to the patient taking a deep breath as stent was being inflated causing the stent to retract proximally and missed the lesion.)  Left main is normal  Circumflex is large and normal  Right coronary contains luminal irregularities in the proximal mid and distal vessel up to 25%.  Overall normal LV function with EF  55% and normal EDP.  Mild mid anterior wall hypokinesis.  RECOMMENDATIONS:   Aspirin and Brilinta for 12 months  Aggressive secondary risk factor modification.  Rosuvastatin dose was increased to 40 mg.  Eligible for discharge in a.m.      Recent Labs: 01/23/2019: Hemoglobin 15.6; Platelets 161 04/15/2019: BUN 19; Creatinine, Ser 1.44; Potassium 4.8; Sodium 138  Recent Lipid Panel    Component Value Date/Time   CHOL 183 01/22/2019 0933   TRIG 144 01/22/2019 0933   HDL 30 (L) 01/22/2019 0933   CHOLHDL 6.1 01/22/2019 0933   VLDL 29 01/22/2019 0933   LDLCALC 124 (  H) 01/22/2019 9678    Physical Exam:    VS:  BP 120/90 (BP Location: Left Arm, Patient Position: Sitting, Cuff Size: Normal)    Pulse 73    Ht 5\' 10"  (1.778 m)    Wt 240 lb (108.9 kg)    SpO2 99%    BMI 34.44 kg/m     Wt Readings from Last 3 Encounters:  07/18/19 240 lb (108.9 kg)  04/15/19 234 lb 9.6 oz (106.4 kg)  01/23/19 224 lb 6.4 oz (101.8 kg)     GEN: Patient is in no acute distress HEENT: Normal NECK: No JVD; No carotid bruits LYMPHATICS: No lymphadenopathy CARDIAC: Hear sounds regular, 2/6 systolic murmur at the apex. RESPIRATORY:  Clear to auscultation without rales, wheezing or rhonchi  ABDOMEN: Soft, non-tender, non-distended MUSCULOSKELETAL:  No edema; No deformity  SKIN: Warm and dry NEUROLOGIC:  Alert and oriented x 3 PSYCHIATRIC:  Normal affect   Signed, 03/25/19, MD  07/18/2019 8:35 AM    [facility]

## 2019-07-18 NOTE — Patient Instructions (Signed)
Medication Instructions:  Your physician recommends that you continue on your current medications as directed. Please refer to the Current Medication list given to you today.  *If you need a refill on your cardiac medications before your next appointment, please call your pharmacy*  Lab Work: Your physician recommends that you have a BMP, CBC, TSH, hepatic and lipid  If you have labs (blood work) drawn today and your tests are completely normal, you will receive your results only by: Marland Kitchen MyChart Message (if you have MyChart) OR . A paper copy in the mail If you have any lab test that is abnormal or we need to change your treatment, we will call you to review the results.  Testing/Procedures: NONE  Follow-Up: At [facility], you and your health needs are our priority.  As part of our continuing mission to provide you with exceptional heart care, we have created designated Provider Care Teams.  These Care Teams include your primary Cardiologist (physician) and Advanced Practice Providers (APPs -  Physician Assistants and Nurse Practitioners) who all work together to provide you with the care you need, when you need it.  Your next appointment:   6 month(s)  The format for your next appointment:   In Person  Provider:   Jyl Heinz, MD

## 2019-07-19 LAB — CBC
Hematocrit: 49.4 % (ref 37.5–51.0)
Hemoglobin: 16.6 g/dL (ref 13.0–17.7)
MCH: 27.5 pg (ref 26.6–33.0)
MCHC: 33.6 g/dL (ref 31.5–35.7)
MCV: 82 fL (ref 79–97)
Platelets: 187 10*3/uL (ref 150–450)
RBC: 6.04 x10E6/uL — ABNORMAL HIGH (ref 4.14–5.80)
RDW: 12.6 % (ref 11.6–15.4)
WBC: 9.3 10*3/uL (ref 3.4–10.8)

## 2019-07-19 LAB — LIPID PANEL
Chol/HDL Ratio: 4.9 ratio (ref 0.0–5.0)
Cholesterol, Total: 177 mg/dL (ref 100–199)
HDL: 36 mg/dL — ABNORMAL LOW
LDL Chol Calc (NIH): 122 mg/dL — ABNORMAL HIGH (ref 0–99)
Triglycerides: 104 mg/dL (ref 0–149)
VLDL Cholesterol Cal: 19 mg/dL (ref 5–40)

## 2019-07-19 LAB — HEPATIC FUNCTION PANEL
ALT: 34 [IU]/L (ref 0–44)
AST: 27 [IU]/L (ref 0–40)
Albumin: 4.8 g/dL (ref 3.8–4.8)
Alkaline Phosphatase: 75 [IU]/L (ref 39–117)
Bilirubin Total: 0.5 mg/dL (ref 0.0–1.2)
Bilirubin, Direct: 0.16 mg/dL (ref 0.00–0.40)
Total Protein: 6.9 g/dL (ref 6.0–8.5)

## 2019-07-19 LAB — BASIC METABOLIC PANEL WITH GFR
BUN/Creatinine Ratio: 13 (ref 10–24)
BUN: 16 mg/dL (ref 8–27)
CO2: 20 mmol/L (ref 20–29)
Calcium: 10 mg/dL (ref 8.6–10.2)
Chloride: 102 mmol/L (ref 96–106)
Creatinine, Ser: 1.2 mg/dL (ref 0.76–1.27)
GFR calc Af Amer: 74 mL/min/{1.73_m2}
GFR calc non Af Amer: 64 mL/min/{1.73_m2}
Glucose: 126 mg/dL — ABNORMAL HIGH (ref 65–99)
Potassium: 4.8 mmol/L (ref 3.5–5.2)
Sodium: 138 mmol/L (ref 134–144)

## 2019-07-19 LAB — TSH: TSH: 1.23 u[IU]/mL (ref 0.450–4.500)

## 2019-07-24 ENCOUNTER — Telehealth: Payer: Self-pay

## 2019-07-24 DIAGNOSIS — E782 Mixed hyperlipidemia: Secondary | ICD-10-CM

## 2019-07-24 MED ORDER — EZETIMIBE 10 MG PO TABS: 10.0000 mg | ORAL_TABLET | Freq: Every day | ORAL | 3 refills | Status: DC

## 2019-07-24 NOTE — Addendum Note (Signed)
Addended by: Beckey Rutter on: 07/24/2019 03:35 PM   Modules accepted: Orders

## 2019-07-24 NOTE — Telephone Encounter (Signed)
-----   Message from Jenean Lindau, MD sent at 07/19/2019 11:09 AM EST ----- Lipids are elevated.  Please check if the patient is taking statins regularly.  If so add Zetia 10 mg daily and liver lipid check in 6 weeks and diet. Jenean Lindau, MD 07/19/2019 11:09 AM

## 2019-07-24 NOTE — Telephone Encounter (Signed)
Results relayed, patient ok with starting zetia. Repeat labs ordered, copy sent to Dr. Tobie Poet.

## 2019-11-21 ENCOUNTER — Other Ambulatory Visit: Payer: Self-pay | Admitting: Family Medicine

## 2019-12-10 ENCOUNTER — Other Ambulatory Visit: Payer: Self-pay | Admitting: Family Medicine

## 2019-12-14 IMAGING — CR CHEST - 2 VIEW
2 series · 2 of 2 positions shown · non-contrast
Comparison: None.

CLINICAL DATA: Chest pain intermittently for 4 weeks.

EXAM:
CHEST - 2 VIEW

[chest pa]
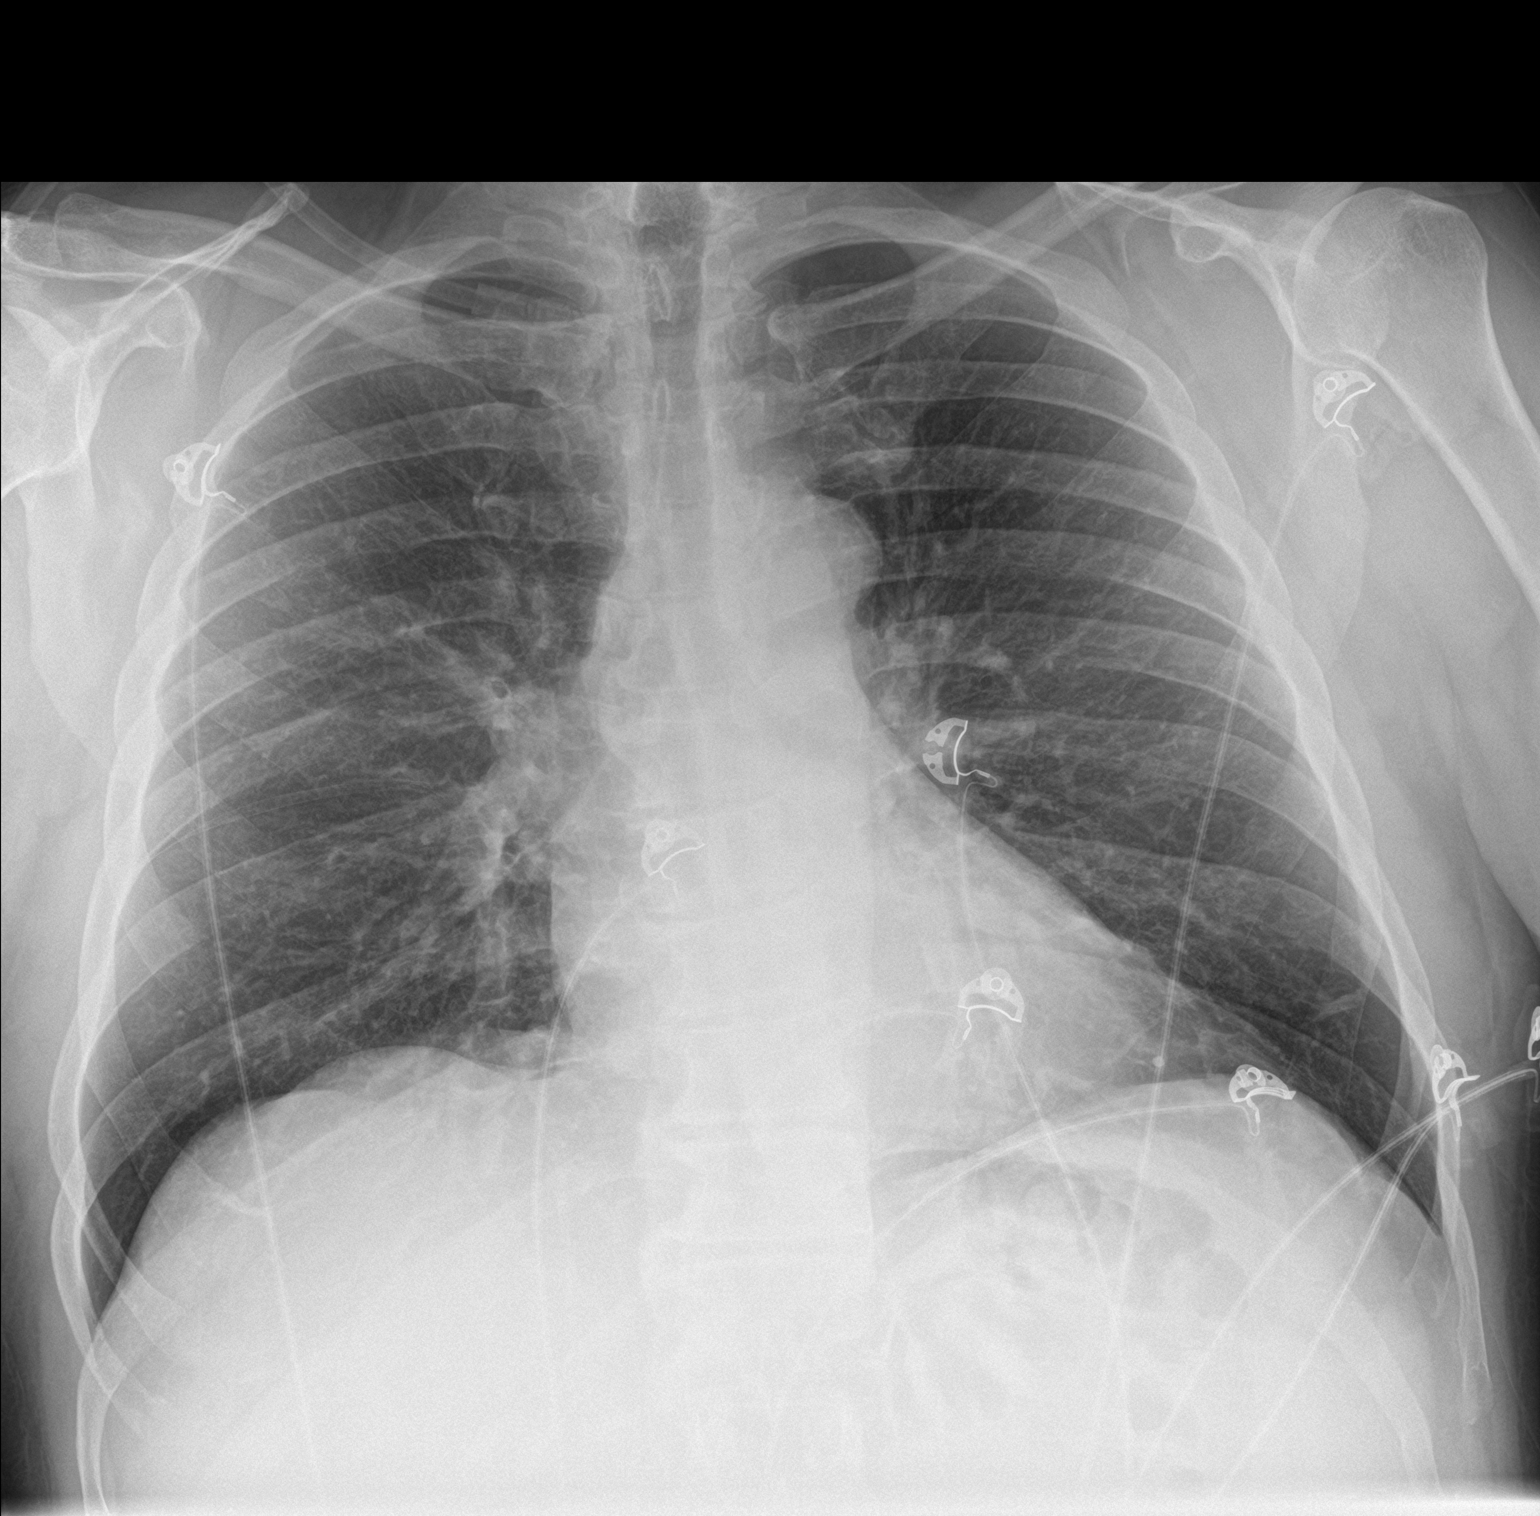

[chest lat]
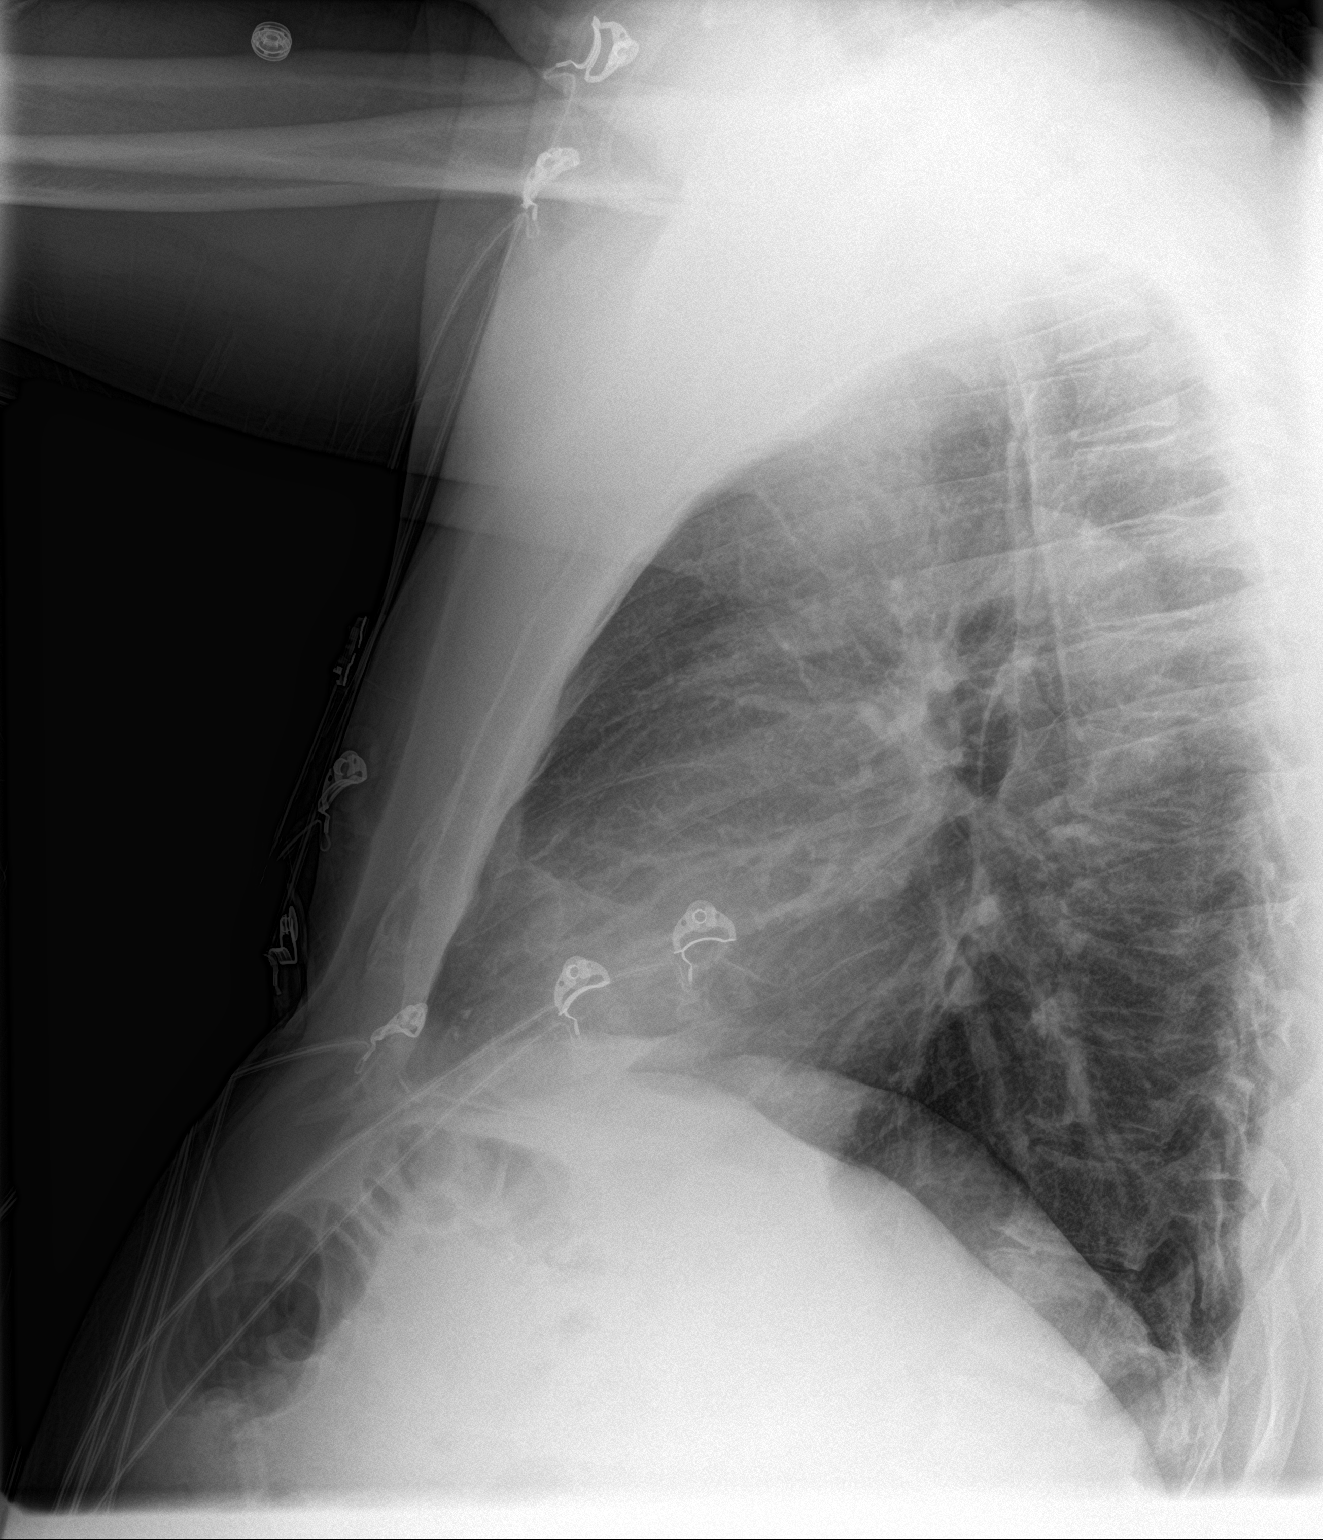

[2 of 2 positions shown; findings below may reference images not displayed]

FINDINGS: The heart size and mediastinal contours are within normal limits.
Both lungs are clear. The visualized skeletal structures are
unremarkable.
IMPRESSION: No active cardiopulmonary disease.

## 2020-01-08 ENCOUNTER — Other Ambulatory Visit: Payer: Self-pay | Admitting: Family Medicine

## 2020-02-06 ENCOUNTER — Other Ambulatory Visit: Payer: Self-pay

## 2020-02-10 ENCOUNTER — Other Ambulatory Visit: Payer: Self-pay

## 2020-02-10 MED ORDER — FENOFIBRATE 145 MG PO TABS: 145.0000 mg | ORAL_TABLET | Freq: Every day | ORAL | 2 refills | Status: AC

## 2020-02-10 MED ORDER — ROSUVASTATIN CALCIUM 40 MG PO TABS: 40.0000 mg | ORAL_TABLET | Freq: Every day | ORAL | 2 refills | Status: AC

## 2020-07-02 ENCOUNTER — Other Ambulatory Visit: Payer: Self-pay | Admitting: Cardiology

## 2020-07-02 ENCOUNTER — Other Ambulatory Visit: Payer: Self-pay | Admitting: Family Medicine

## 2020-07-16 ENCOUNTER — Other Ambulatory Visit: Payer: Self-pay | Admitting: Cardiology

## 2020-07-16 NOTE — Telephone Encounter (Signed)
Rx refill sent to pharmacy. 

## 2020-07-31 ENCOUNTER — Other Ambulatory Visit: Payer: Self-pay | Admitting: Cardiology

## 2020-08-22 ENCOUNTER — Other Ambulatory Visit: Payer: Self-pay | Admitting: Cardiology

## 2020-08-22 ENCOUNTER — Other Ambulatory Visit: Payer: Self-pay | Admitting: Family Medicine

## 2020-08-24 NOTE — Telephone Encounter (Signed)
Needs appointment for future refills.

## 2020-09-15 ENCOUNTER — Other Ambulatory Visit: Payer: Self-pay | Admitting: Family Medicine

## 2020-10-23 DIAGNOSIS — I10 Essential (primary) hypertension: Secondary | ICD-10-CM | POA: Diagnosis not present

## 2020-10-23 DIAGNOSIS — K219 Gastro-esophageal reflux disease without esophagitis: Secondary | ICD-10-CM | POA: Diagnosis not present

## 2020-10-23 DIAGNOSIS — E782 Mixed hyperlipidemia: Secondary | ICD-10-CM | POA: Diagnosis not present

## 2020-10-23 DIAGNOSIS — H9193 Unspecified hearing loss, bilateral: Secondary | ICD-10-CM | POA: Diagnosis not present

## 2020-10-23 DIAGNOSIS — R7309 Other abnormal glucose: Secondary | ICD-10-CM | POA: Diagnosis not present

## 2020-10-23 DIAGNOSIS — Z0000 Encounter for general adult medical examination without abnormal findings: Secondary | ICD-10-CM | POA: Diagnosis not present

## 2020-12-02 DIAGNOSIS — Z6834 Body mass index (BMI) 34.0-34.9, adult: Secondary | ICD-10-CM | POA: Diagnosis not present

## 2020-12-02 DIAGNOSIS — I209 Angina pectoris, unspecified: Secondary | ICD-10-CM | POA: Diagnosis not present

## 2020-12-02 DIAGNOSIS — I712 Thoracic aortic aneurysm, without rupture: Secondary | ICD-10-CM | POA: Diagnosis not present

## 2020-12-02 DIAGNOSIS — E782 Mixed hyperlipidemia: Secondary | ICD-10-CM | POA: Diagnosis not present

## 2020-12-18 DIAGNOSIS — I10 Essential (primary) hypertension: Secondary | ICD-10-CM | POA: Diagnosis not present

## 2020-12-18 DIAGNOSIS — I712 Thoracic aortic aneurysm, without rupture: Secondary | ICD-10-CM | POA: Diagnosis not present

## 2020-12-23 DIAGNOSIS — I209 Angina pectoris, unspecified: Secondary | ICD-10-CM | POA: Diagnosis not present

## 2021-01-14 DIAGNOSIS — R102 Pelvic and perineal pain: Secondary | ICD-10-CM | POA: Diagnosis not present

## 2021-01-14 DIAGNOSIS — M5416 Radiculopathy, lumbar region: Secondary | ICD-10-CM | POA: Diagnosis not present

## 2021-01-14 DIAGNOSIS — M48062 Spinal stenosis, lumbar region with neurogenic claudication: Secondary | ICD-10-CM | POA: Diagnosis not present

## 2021-01-28 DIAGNOSIS — M25552 Pain in left hip: Secondary | ICD-10-CM | POA: Diagnosis not present

## 2021-01-28 DIAGNOSIS — M25551 Pain in right hip: Secondary | ICD-10-CM | POA: Diagnosis not present

## 2021-01-28 DIAGNOSIS — M7612 Psoas tendinitis, left hip: Secondary | ICD-10-CM | POA: Diagnosis not present

## 2021-01-28 DIAGNOSIS — M545 Low back pain, unspecified: Secondary | ICD-10-CM | POA: Diagnosis not present

## 2021-02-10 DIAGNOSIS — M7612 Psoas tendinitis, left hip: Secondary | ICD-10-CM | POA: Diagnosis not present

## 2021-02-10 DIAGNOSIS — M25551 Pain in right hip: Secondary | ICD-10-CM | POA: Diagnosis not present

## 2021-02-10 DIAGNOSIS — M25552 Pain in left hip: Secondary | ICD-10-CM | POA: Diagnosis not present

## 2021-02-10 DIAGNOSIS — M545 Low back pain, unspecified: Secondary | ICD-10-CM | POA: Diagnosis not present

## 2021-02-19 DIAGNOSIS — L570 Actinic keratosis: Secondary | ICD-10-CM | POA: Diagnosis not present

## 2021-02-19 DIAGNOSIS — L723 Sebaceous cyst: Secondary | ICD-10-CM | POA: Diagnosis not present

## 2021-02-23 DIAGNOSIS — M7612 Psoas tendinitis, left hip: Secondary | ICD-10-CM | POA: Diagnosis not present

## 2021-02-23 DIAGNOSIS — M545 Low back pain, unspecified: Secondary | ICD-10-CM | POA: Diagnosis not present

## 2021-02-23 DIAGNOSIS — M25551 Pain in right hip: Secondary | ICD-10-CM | POA: Diagnosis not present

## 2021-02-23 DIAGNOSIS — M25552 Pain in left hip: Secondary | ICD-10-CM | POA: Diagnosis not present

## 2021-03-01 DIAGNOSIS — M48062 Spinal stenosis, lumbar region with neurogenic claudication: Secondary | ICD-10-CM | POA: Diagnosis not present

## 2021-03-23 DIAGNOSIS — L570 Actinic keratosis: Secondary | ICD-10-CM | POA: Diagnosis not present

## 2021-04-22 DIAGNOSIS — Z0000 Encounter for general adult medical examination without abnormal findings: Secondary | ICD-10-CM | POA: Diagnosis not present

## 2021-04-26 DIAGNOSIS — E119 Type 2 diabetes mellitus without complications: Secondary | ICD-10-CM | POA: Diagnosis not present

## 2021-04-26 DIAGNOSIS — Z0000 Encounter for general adult medical examination without abnormal findings: Secondary | ICD-10-CM | POA: Diagnosis not present

## 2021-04-26 DIAGNOSIS — E782 Mixed hyperlipidemia: Secondary | ICD-10-CM | POA: Diagnosis not present

## 2021-04-26 DIAGNOSIS — Z6834 Body mass index (BMI) 34.0-34.9, adult: Secondary | ICD-10-CM | POA: Diagnosis not present

## 2021-07-13 DIAGNOSIS — E782 Mixed hyperlipidemia: Secondary | ICD-10-CM | POA: Diagnosis not present

## 2021-07-13 DIAGNOSIS — I25119 Atherosclerotic heart disease of native coronary artery with unspecified angina pectoris: Secondary | ICD-10-CM | POA: Diagnosis not present

## 2021-07-13 DIAGNOSIS — Z6834 Body mass index (BMI) 34.0-34.9, adult: Secondary | ICD-10-CM | POA: Diagnosis not present
